# Patient Record
Sex: Female | Born: 1995 | Race: White | Hispanic: No | Marital: Married | Smoking: Never smoker
Health system: Southern US, Community
[De-identification: ages and names within clinical notes are randomized; demographics above are authoritative.]

---

## 2016-12-20 ENCOUNTER — Ambulatory Visit (INDEPENDENT_AMBULATORY_CARE_PROVIDER_SITE_OTHER): Payer: 59 | Admitting: Family

## 2016-12-20 ENCOUNTER — Encounter: Payer: Self-pay | Admitting: Family

## 2016-12-20 VITALS — BP 129/79 | HR 85 | Temp 98.7°F | Ht 68.0 in | Wt 125.4 lb

## 2016-12-20 DIAGNOSIS — Z0000 Encounter for general adult medical examination without abnormal findings: Secondary | ICD-10-CM

## 2016-12-20 DIAGNOSIS — Z111 Encounter for screening for respiratory tuberculosis: Secondary | ICD-10-CM

## 2016-12-20 NOTE — Progress Notes (Signed)
   Subjective:    Patient ID: Dorothy Douglas, female    DOB: [birth year], 21 y.o.   MRN: [id number]  HPI Pt presents to the office today for CPE without pap. Currently taking OC with no complaints. Pt denies any headache, palpitations, SOB, or edema at this time.     Review of Systems  All other systems reviewed and are negative.  History reviewed. No pertinent family history.  Social History   Social History  . Marital status: Married    Spouse name: N/A  . Number of children: N/A  . Years of education: N/A   Social History Main Topics  . Smoking status: Never Smoker  . Smokeless tobacco: Never Used  . Alcohol use 0.6 oz/week    1 Glasses of wine per week  . Drug use: No  . Sexual activity: Yes   Other Topics Concern  . None   Social History Narrative  . None       Objective:   Physical Exam  Constitutional: She is oriented to person, place, and time. She appears well-developed and well-nourished. No distress.  HENT:  Head: Normocephalic and atraumatic.  Right Ear: External ear normal.  Left Ear: External ear normal.  Nose: Nose normal.  Mouth/Throat: Oropharynx is clear and moist.  Eyes: Pupils are equal, round, and reactive to light.  Neck: Normal range of motion. Neck supple. No thyromegaly present.  Cardiovascular: Normal rate, regular rhythm, normal heart sounds and intact distal pulses.   No murmur heard. Pulmonary/Chest: Effort normal and breath sounds normal. No respiratory distress. She has no wheezes.  Abdominal: Soft. Bowel sounds are normal. She exhibits no distension. There is no tenderness.  Musculoskeletal: Normal range of motion. She exhibits no edema or tenderness.  Neurological: She is alert and oriented to person, place, and time.  Skin: Skin is warm and dry.  Psychiatric: She has a normal mood and affect. Her behavior is normal. Judgment and thought content normal.  Vitals reviewed.    BP 129/79   Pulse 85   Temp 98.7 F (37.1 C)  (Oral)   Ht 5\' 8"  (1.727 m)   Wt 125 lb 6.4 oz (56.9 kg)   LMP 12/15/2016 (Exact Date)   BMI 19.07 kg/m      Assessment & Plan:  1. Annual physical exam   Continue all meds Pt refuses labs today Health Maintenance reviewed Diet and exercise encouraged RTO 1 year   Jannifer Rodney, FNP

## 2016-12-20 NOTE — Addendum Note (Signed)
Addended by: Tamera Punt on: 12/20/2016 02:45 PM   Modules accepted: Orders

## 2016-12-20 NOTE — Patient Instructions (Signed)

## 2016-12-23 ENCOUNTER — Ambulatory Visit: Payer: Self-pay | Admitting: Pediatrics

## 2017-07-28 ENCOUNTER — Ambulatory Visit (INDEPENDENT_AMBULATORY_CARE_PROVIDER_SITE_OTHER): Payer: BC Managed Care – PPO

## 2017-07-28 ENCOUNTER — Ambulatory Visit: Payer: BC Managed Care – PPO | Admitting: Family

## 2017-07-28 ENCOUNTER — Encounter: Payer: Self-pay | Admitting: Family

## 2017-07-28 VITALS — BP 126/71 | HR 81 | Temp 97.6°F | Ht 68.0 in | Wt 132.2 lb

## 2017-07-28 DIAGNOSIS — R0781 Pleurodynia: Secondary | ICD-10-CM

## 2017-07-28 DIAGNOSIS — R1011 Right upper quadrant pain: Secondary | ICD-10-CM

## 2017-07-28 DIAGNOSIS — M546 Pain in thoracic spine: Secondary | ICD-10-CM | POA: Diagnosis not present

## 2017-07-28 DIAGNOSIS — G8929 Other chronic pain: Secondary | ICD-10-CM

## 2017-07-28 NOTE — Patient Instructions (Signed)
Costochondritis Costochondritis is swelling and irritation (inflammation) of the tissue (cartilage) that connects your ribs to your breastbone (sternum). This causes pain in the front of your chest. The pain usually starts gradually and involves more than one rib. What are the causes? The exact cause of this condition is not always known. It results from stress on the cartilage where your ribs attach to your sternum. The cause of this stress could be:  Chest injury (trauma).  Exercise or activity, such as lifting.  Severe coughing.  What increases the risk? You may be at higher risk for this condition if you:  Are female.  Are 30?24 years old.  Recently started a new exercise or work activity.  Have low levels of vitamin D.  Have a condition that makes you cough frequently.  What are the signs or symptoms? The main symptom of this condition is chest pain. The pain:  Usually starts gradually and can be sharp or dull.  Gets worse with deep breathing, coughing, or exercise.  Gets better with rest.  May be worse when you press on the sternum-rib connection (tenderness).  How is this diagnosed? This condition is diagnosed based on your symptoms, medical history, and a physical exam. Your health care provider will check for tenderness when pressing on your sternum. This is the most important finding. You may also have tests to rule out other causes of chest pain. These may include:  A chest X-ray to check for lung problems.  An electrocardiogram (ECG) to see if you have a heart problem that could be causing the pain.  An imaging scan to rule out a chest or rib fracture.  How is this treated? This condition usually goes away on its own over time. Your health care provider may prescribe an NSAID to reduce pain and inflammation. Your health care provider may also suggest that you:  Rest and avoid activities that make pain worse.  Apply heat or cold to the area to reduce pain  and inflammation.  Do exercises to stretch your chest muscles.  If these treatments do not help, your health care provider may inject a numbing medicine at the sternum-rib connection to help relieve the pain. Follow these instructions at home:  Avoid activities that make pain worse. This includes any activities that use chest, abdominal, and side muscles.  If directed, put ice on the painful area: ? Put ice in a plastic bag. ? Place a towel between your skin and the bag. ? Leave the ice on for 20 minutes, 2-3 times a day.  If directed, apply heat to the affected area as often as told by your health care provider. Use the heat source that your health care provider recommends, such as a moist heat pack or a heating pad. ? Place a towel between your skin and the heat source. ? Leave the heat on for 20-30 minutes. ? Remove the heat if your skin turns bright red. This is especially important if you are unable to feel pain, heat, or cold. You may have a greater risk of getting burned.  Take over-the-counter and prescription medicines only as told by your health care provider.  Return to your normal activities as told by your health care provider. Ask your health care provider what activities are safe for you.  Keep all follow-up visits as told by your health care provider. This is important. Contact a health care provider if:  You have chills or a fever.  Your pain does not go   away or it gets worse.  You have a cough that does not go away (is persistent). Get help right away if:  You have shortness of breath. This information is not intended to replace advice given to you by your health care provider. Make sure you discuss any questions you have with your health care provider. Document Released: 01/30/2005 Document Revised: 11/10/2015 Document Reviewed: 08/16/2015 Elsevier Interactive Patient Education  2018 Elsevier Inc[employer]   

## 2017-07-28 NOTE — Progress Notes (Signed)
Subjective:    Patient ID: Dorothy Douglas, female    DOB: 1995-05-09, 22 y.o.   MRN: [id number]  HPI PT presents to the office today with right rib pain that radiates to her back. States this pain is been going on and off over the last 2 years. PT states she has taken naprosyn with mild relief.  Pt states she went to an Urgent Care last year and had a negative EKG and chest x-ray and was told it was inflammation. States the pain has never went away. States she has constant aching pain of 6-7 out 10 pain and has "flare ups" where the pain is worse and she can not lay on her back or side.   She not recall anything that makes the pain worse or triggers it.     Review of Systems  All other systems reviewed and are negative.      Objective:   Physical Exam  Constitutional: She is oriented to person, place, and time. She appears well-developed and well-nourished. No distress.  HENT:  Head: Normocephalic and atraumatic.  Right Ear: External ear normal.  Left Ear: External ear normal.  Nose: Nose normal.  Mouth/Throat: Oropharynx is clear and moist.  Eyes: Pupils are equal, round, and reactive to light.  Neck: Normal range of motion. Neck supple. No thyromegaly present.  Cardiovascular: Normal rate, regular rhythm, normal heart sounds and intact distal pulses.  No murmur heard. Pulmonary/Chest: Effort normal and breath sounds normal. No respiratory distress. She has no wheezes.  Abdominal: Soft. Bowel sounds are normal. She exhibits no distension. There is tenderness (RUQ and LUQ).  Musculoskeletal: Normal range of motion. She exhibits no edema or tenderness.  Neurological: She is alert and oriented to person, place, and time.  Skin: Skin is warm and dry.  Psychiatric: She has a normal mood and affect. Her behavior is normal. Judgment and thought content normal.  Vitals reviewed.     BP 126/71   Pulse 81   Temp 97.6 F (36.4 C) (Oral)   Ht 5' 8" (1.727 m)   Wt 132 lb 3.2 oz  (60 kg)   BMI 20.10 kg/m      Assessment & Plan:  1. Right upper quadrant abdominal pain - Anemia Profile B - CMP14+EGFR - H Pylori, IGM, IGG, IGA AB - VITAMIN D 25 Hydroxy (Vit-D Deficiency, Fractures)  2. Chronic right-sided thoracic back pain - Anemia Profile B - CMP14+EGFR - DG Chest 2 View; Future - VITAMIN D 25 Hydroxy (Vit-D Deficiency, Fractures)  3. Rib pain - Anemia Profile B - CMP14+EGFR - DG Chest 2 View; Future - VITAMIN D 25 Hydroxy (Vit-D Deficiency, Fractures)  Labs pending If elevated WBC or labs negative will order abd [place] to rule out cholecystitis   Evelina Dun, FNP

## 2017-07-29 LAB — ANEMIA PROFILE B
BASOS: 1 %
Basophils Absolute: 0 10*3/uL (ref 0.0–0.2)
EOS (ABSOLUTE): 0.1 10*3/uL (ref 0.0–0.4)
EOS: 2 %
FERRITIN: 31 ng/mL (ref 15–150)
Folate: 18.9 ng/mL (ref >3.0)
HEMATOCRIT: 41 % (ref 34.0–46.6)
HEMOGLOBIN: 13.3 g/dL (ref 11.1–15.9)
IMMATURE GRANS (ABS): 0 10*3/uL (ref 0.0–0.1)
IRON: 118 ug/dL (ref 27–159)
Immature Granulocytes: 0 %
Iron Saturation: 33 % (ref 15–55)
LYMPHS: 42 %
Lymphocytes Absolute: 2.3 10*3/uL (ref 0.7–3.1)
MCH: 28.3 pg (ref 26.6–33.0)
MCHC: 32.4 g/dL (ref 31.5–35.7)
MCV: 87 fL (ref 79–97)
MONOCYTES: 9 %
Monocytes Absolute: 0.5 10*3/uL (ref 0.1–0.9)
Neutrophils Absolute: 2.5 10*3/uL (ref 1.4–7.0)
Neutrophils: 46 %
Platelets: 298 10*3/uL (ref 150–379)
RBC: 4.7 x10E6/uL (ref 3.77–5.28)
RDW: 13.1 % (ref 12.3–15.4)
RETIC CT PCT: 0.9 % (ref 0.6–2.6)
TIBC: 363 ug/dL (ref 250–450)
UIBC: 245 ug/dL (ref 131–425)
Vitamin B-12: 468 pg/mL (ref 232–1245)
WBC: 5.5 10*3/uL (ref 3.4–10.8)

## 2017-07-29 LAB — CMP14+EGFR
A/G RATIO: 1.6 (ref 1.2–2.2)
ALBUMIN: 4.5 g/dL (ref 3.5–5.5)
ALT: 12 IU/L (ref 0–32)
AST: 11 IU/L (ref 0–40)
Alkaline Phosphatase: 54 IU/L (ref 39–117)
BUN/Creatinine Ratio: 12 (ref 9–23)
BUN: 8 mg/dL (ref 6–20)
Bilirubin Total: <0.2 mg/dL (ref 0.0–1.2)
CALCIUM: 9.3 mg/dL (ref 8.7–10.2)
CO2: 24 mmol/L (ref 20–29)
Chloride: 102 mmol/L (ref 96–106)
Creatinine, Ser: 0.65 mg/dL (ref 0.57–1.00)
GFR calc Af Amer: 144 mL/min/{1.73_m2} (ref >59)
GFR, EST NON AFRICAN AMERICAN: 125 mL/min/{1.73_m2} (ref >59)
Globulin, Total: 2.9 g/dL (ref 1.5–4.5)
Glucose: 69 mg/dL (ref 65–99)
Potassium: 3.9 mmol/L (ref 3.5–5.2)
Sodium: 141 mmol/L (ref 134–144)
TOTAL PROTEIN: 7.4 g/dL (ref 6.0–8.5)

## 2017-07-29 LAB — H PYLORI, IGM, IGG, IGA AB
H. pylori, IgA Abs: <9 units (ref 0.0–8.9)
H. pylori, IgG AbS: <0.8 Index Value (ref 0.00–0.79)

## 2017-07-30 LAB — SPECIMEN STATUS REPORT

## 2017-07-30 LAB — VITAMIN D 25 HYDROXY (VIT D DEFICIENCY, FRACTURES): VIT D 25 HYDROXY: 32.3 ng/mL (ref 30.0–100.0)

## 2017-07-31 NOTE — Progress Notes (Signed)
Pt is aware of lab results and verbalizes understanding

## 2017-08-05 ENCOUNTER — Ambulatory Visit: Payer: BC Managed Care – PPO | Admitting: Family

## 2017-08-05 ENCOUNTER — Encounter: Payer: Self-pay | Admitting: Family

## 2017-08-05 VITALS — BP 122/71 | HR 71 | Temp 97.6°F | Ht 68.0 in | Wt 132.6 lb

## 2017-08-05 DIAGNOSIS — M545 Low back pain, unspecified: Secondary | ICD-10-CM

## 2017-08-05 DIAGNOSIS — R1011 Right upper quadrant pain: Secondary | ICD-10-CM | POA: Diagnosis not present

## 2017-08-05 LAB — URINALYSIS, COMPLETE
Bilirubin, UA: NEGATIVE
Glucose, UA: NEGATIVE
KETONES UA: NEGATIVE
Leukocytes, UA: NEGATIVE
NITRITE UA: NEGATIVE
Protein, UA: NEGATIVE
RBC UA: NEGATIVE
SPEC GRAV UA: 1.025 (ref 1.005–1.030)
UUROB: 0.2 mg/dL (ref 0.2–1.0)
pH, UA: 7 (ref 5.0–7.5)

## 2017-08-05 LAB — MICROSCOPIC EXAMINATION
RBC MICROSCOPIC, UA: NONE SEEN /HPF (ref 0–2)
Renal Epithel, UA: NONE SEEN /hpf

## 2017-08-05 NOTE — Patient Instructions (Addendum)

## 2017-08-05 NOTE — Progress Notes (Signed)
   Subjective:    Patient ID: Dorothy Douglas, female    DOB: 11/11/1995, 22 y.o.   MRN: [id number]  HPI Pt presents to the office today with stabbing abdominal pain that radiates to her back that started over a year ago, but has become worse. PT states she has been diagnosed with costochondritis and given naprosyn with no relief.   PT was seen  07/28/17 and had negative chest x-ray, normal CMP, Anemia profile, H pylori, and Vitamin D. She reports her pain is worse since our last office visit. Reports constant stabbing, aching pain of 7 out 10. States the pain is worse at the end of the day.     Review of Systems  Gastrointestinal: Positive for abdominal pain.  Musculoskeletal: Positive for back pain.  All other systems reviewed and are negative.      Objective:   Physical Exam  Constitutional: She is oriented to person, place, and time. She appears well-developed and well-nourished. No distress.  HENT:  Head: Normocephalic.  Eyes: Pupils are equal, round, and reactive to light.  Neck: Normal range of motion. Neck supple. No thyromegaly present.  Cardiovascular: Normal rate, regular rhythm, normal heart sounds and intact distal pulses.  No murmur heard. Pulmonary/Chest: Effort normal and breath sounds normal. No respiratory distress. She has no wheezes.  Abdominal: Soft. Bowel sounds are normal. She exhibits no distension. There is tenderness (RUQ). There is rebound.  Musculoskeletal: Normal range of motion. She exhibits no edema or tenderness.  Neurological: She is alert and oriented to person, place, and time.  Skin: Skin is warm and dry.  Psychiatric: She has a normal mood and affect. Her behavior is normal. Judgment and thought content normal.  Vitals reviewed.  BP 122/71   Pulse 71   Temp 97.6 F (36.4 C) (Oral)   Ht 5\' 8"  (1.727 m)   Wt 132 lb 9.6 oz (60.1 kg)   BMI 20.16 kg/m       Assessment & Plan:  1. RUQ pain - Urinalysis, Complete - US Abdomen Complete;  Future  2. Acute bilateral low back pain without sciatica - Urinalysis, Complete - US Abdomen Complete; Future  Urine pending Will do Abd US to rule out gallbladder RTO prn or if pain worsens or does not improve  Jannifer Rodneyhristy Vernard Gram, FNP

## 2017-08-13 ENCOUNTER — Ambulatory Visit (HOSPITAL_COMMUNITY)
Admission: RE | Admit: 2017-08-13 | Discharge: 2017-08-13 | Disposition: A | Discharge status: Home / Self Care | Payer: Self-pay | Source: Ambulatory Visit | Attending: Family | Admitting: Family

## 2017-08-13 ENCOUNTER — Ambulatory Visit (HOSPITAL_COMMUNITY)
Admission: RE | Admit: 2017-08-13 | Discharge: 2017-08-13 | Disposition: A | Discharge status: Home / Self Care | Payer: BC Managed Care – PPO | Source: Ambulatory Visit | Attending: Family | Admitting: Family

## 2017-08-13 DIAGNOSIS — R1011 Right upper quadrant pain: Secondary | ICD-10-CM

## 2017-08-13 DIAGNOSIS — M545 Low back pain, unspecified: Secondary | ICD-10-CM

## 2017-08-14 ENCOUNTER — Telehealth: Payer: Self-pay | Admitting: Family

## 2017-08-14 ENCOUNTER — Encounter: Payer: Self-pay | Admitting: Family

## 2017-08-14 DIAGNOSIS — R1011 Right upper quadrant pain: Secondary | ICD-10-CM

## 2017-08-14 NOTE — Telephone Encounter (Signed)
SHE WANTS TO KNOW WHAT IS NEXT? STILL HAVING PAIN. DOES THIS RULE GALLBLADDER OUT

## 2017-08-14 NOTE — Telephone Encounter (Signed)
Pt is rtn call about test results

## 2017-08-15 NOTE — Addendum Note (Signed)
Addended by: Jannifer RodneyHAWKS, CHRISTY A on: 08/15/2017 10:33 AM   Modules accepted: Orders

## 2017-08-15 NOTE — Telephone Encounter (Signed)
Hida scan  Ordered

## 2017-08-15 NOTE — Telephone Encounter (Signed)
See other note

## 2017-08-19 ENCOUNTER — Encounter (HOSPITAL_COMMUNITY): Payer: Self-pay

## 2017-08-19 ENCOUNTER — Encounter (HOSPITAL_COMMUNITY)
Admission: RE | Admit: 2017-08-19 | Discharge: 2017-08-19 | Disposition: A | Discharge status: Home / Self Care | Payer: BC Managed Care – PPO | Source: Ambulatory Visit | Attending: Family | Admitting: Family

## 2017-08-19 ENCOUNTER — Encounter: Payer: Self-pay | Admitting: Family

## 2017-08-19 ENCOUNTER — Other Ambulatory Visit: Payer: Self-pay | Admitting: Family

## 2017-08-19 DIAGNOSIS — R1011 Right upper quadrant pain: Secondary | ICD-10-CM | POA: Insufficient documentation

## 2017-08-19 MED ORDER — MORPHINE SULFATE (PF) 4 MG/ML IV SOLN
INTRAVENOUS | Status: AC
  Administered 2017-08-19: 2.4 mg
  Filled 2017-08-19: qty 1

## 2017-08-19 MED ORDER — MORPHINE SULFATE (PF) 4 MG/ML IV SOLN: 2.4000 mg | Freq: Once | INTRAVENOUS | Status: DC

## 2017-08-19 MED ORDER — TECHNETIUM TC 99M MEBROFENIN IV KIT
5.0000 | PACK | Freq: Once | INTRAVENOUS | Status: AC | PRN
  Administered 2017-08-19: 5 via INTRAVENOUS

## 2017-08-26 ENCOUNTER — Other Ambulatory Visit: Payer: BC Managed Care – PPO

## 2017-08-26 ENCOUNTER — Ambulatory Visit: Payer: BC Managed Care – PPO | Admitting: Family

## 2017-08-26 ENCOUNTER — Encounter: Payer: Self-pay | Admitting: Family

## 2017-08-26 VITALS — BP 116/68 | HR 72 | Temp 97.4°F | Ht 68.0 in | Wt 130.4 lb

## 2017-08-26 DIAGNOSIS — R0781 Pleurodynia: Secondary | ICD-10-CM | POA: Diagnosis not present

## 2017-08-26 DIAGNOSIS — M546 Pain in thoracic spine: Secondary | ICD-10-CM | POA: Diagnosis not present

## 2017-08-26 DIAGNOSIS — G8929 Other chronic pain: Secondary | ICD-10-CM | POA: Diagnosis not present

## 2017-08-26 MED ORDER — CYCLOBENZAPRINE HCL 5 MG PO TABS: 5.0000 mg | ORAL_TABLET | Freq: Three times a day (TID) | ORAL | 1 refills | Status: DC | PRN

## 2017-08-26 MED ORDER — PREDNISONE 10 MG (21) PO TBPK: ORAL_TABLET | ORAL | 0 refills | Status: DC

## 2017-08-26 MED ORDER — DICLOFENAC SODIUM 75 MG PO TBEC: 75.0000 mg | DELAYED_RELEASE_TABLET | Freq: Two times a day (BID) | ORAL | 0 refills | Status: DC

## 2017-08-26 NOTE — Progress Notes (Signed)
   Subjective:    Patient ID: Dorothy Douglas, female    DOB: 04/08/1996, 22 y.o.   MRN: [id number]  HPI PT presents to the office today with recurrent right and left rib pain. Pt has had negative US and HIDA scan. States her pain is constant, but worse when she stands for long periods of time. She reports a constant achy pain of 3 but up to a 7-8 out 10 after she has stood all day.    She has tried naprosyn with mild relief.    Review of Systems  All other systems reviewed and are negative.      Objective:   Physical Exam  Constitutional: She is oriented to person, place, and time. She appears well-developed and well-nourished. No distress.  HENT:  Head: Normocephalic and atraumatic.  Right Ear: External ear normal.  Mouth/Throat: Oropharynx is clear and moist.  Eyes: Pupils are equal, round, and reactive to light.  Neck: Normal range of motion. Neck supple. No thyromegaly present.  Cardiovascular: Normal rate, regular rhythm, normal heart sounds and intact distal pulses.  No murmur heard. Pulmonary/Chest: Effort normal and breath sounds normal. No respiratory distress. She has no wheezes.  Abdominal: Soft. Bowel sounds are normal. She exhibits no distension. There is no tenderness.  Musculoskeletal: Normal range of motion. She exhibits tenderness. She exhibits no edema.  In thoracic area with flexion and deep breathing  Neurological: She is alert and oriented to person, place, and time. She has normal reflexes. No cranial nerve deficit.  Skin: Skin is warm and dry.  Psychiatric: She has a normal mood and affect. Her behavior is normal. Judgment and thought content normal.  Vitals reviewed.     BP 116/68   Pulse 72   Temp (!) 97.4 F (36.3 C) (Oral)   Ht 5\' 8"  (1.727 m)   Wt 130 lb 6.4 oz (59.1 kg)   BMI 19.83 kg/m      Assessment & Plan:  1. Rib pain - Ambulatory referral to Spine Surgery - diclofenac (VOLTAREN) 75 MG EC tablet; Take 1 tablet (75 mg total) by mouth  2 (two) times daily.  Dispense: 30 tablet; Refill: 0  2. Chronic bilateral thoracic back pain Rest Ice ROM exercises  RTO prn and follow up with specialists  - Ambulatory referral to Spine Surgery - diclofenac (VOLTAREN) 75 MG EC tablet; Take 1 tablet (75 mg total) by mouth 2 (two) times daily.  Dispense: 30 tablet; Refill: 0 - cyclobenzaprine (FLEXERIL) 5 MG tablet; Take 1 tablet (5 mg total) by mouth 3 (three) times daily as needed for muscle spasms.  Dispense: 30 tablet; Refill: 1 - predniSONE (STERAPRED UNI-PAK 21 TAB) 10 MG (21) TBPK tablet; Use as directed  Dispense: 21 tablet; Refill: 0    Jannifer Rodneyhristy Lashauna Arpin, FNP

## 2017-08-26 NOTE — Patient Instructions (Signed)
Thoracic Strain A thoracic strain, which is sometimes called a mid-back strain, is an injury to the muscles or tendons that attach to the upper part of your back behind your chest. This type of injury occurs when a muscle is overstretched or overloaded. Thoracic strains can range from mild to severe. Mild strains may involve stretching a muscle or tendon without tearing it. These injuries may heal in 1-2 weeks. More severe strains involve tearing of muscle fibers or tendons. These will cause more pain and may take 6-8 weeks to heal. What are the causes? This condition may be caused by:  An injury in which a sudden force is placed on the muscle.  Exercising without properly warming up.  Overuse of the muscle.  Improper form during certain movements.  Other injuries that surround or cause stress on the mid-back, causing a strain on the muscles.  In some cases, the cause may not be known. What increases the risk? This injury is more common in:  Athletes.  People with obesity.  What are the signs or symptoms? The main symptom of this condition is pain, especially with movement. Other symptoms include:  Bruising.  Swelling.  Spasm.  How is this diagnosed? This condition may be diagnosed with a physical exam. X-rays may be taken to check for a fracture. How is this treated? This condition may be treated with:  Resting and icing the injured area.  Physical therapy. This will involve doing stretching and strengthening exercises.  Medicines for pain and inflammation.  Follow these instructions at home:  Rest as needed. Follow instructions from your health care provider about any restrictions on activity.  If directed, apply ice to the injured area: ? Put ice in a plastic bag. ? Place a towel between your skin and the bag. ? Leave the ice on for 20 minutes, 2-3 times per day.  Take over-the-counter and prescription medicines only as told by your health care  provider.  Begin doing exercises as told by your health care provider or physical therapist.  Always warm up properly before physical activity or sports.  Bend your knees before you lift heavy objects.  Keep all follow-up visits as told by your health care provider. This is important. Contact a health care provider if:  Your pain is not helped by medicine.  Your pain, bruising, or swelling is getting worse.  You have a fever. Get help right away if:  You have shortness of breath.  You have chest pain.  You develop numbness or weakness in your legs.  You have involuntary loss of urine (urinary incontinence). This information is not intended to replace advice given to you by your health care provider. Make sure you discuss any questions you have with your health care provider. Document Released: 07/13/2003 Document Revised: 12/23/2015 Document Reviewed: 06/16/2014 Elsevier Interactive Patient Education  2018 Elsevier Inc.  

## 2017-09-02 ENCOUNTER — Encounter: Payer: Self-pay | Admitting: Family

## 2017-09-30 ENCOUNTER — Ambulatory Visit: Payer: BC Managed Care – PPO | Admitting: Family

## 2017-09-30 ENCOUNTER — Encounter: Payer: Self-pay | Admitting: Family

## 2017-09-30 VITALS — BP 125/78 | HR 100 | Temp 99.1°F | Ht 68.0 in | Wt 129.0 lb

## 2017-09-30 DIAGNOSIS — J020 Streptococcal pharyngitis: Secondary | ICD-10-CM | POA: Diagnosis not present

## 2017-09-30 DIAGNOSIS — J029 Acute pharyngitis, unspecified: Secondary | ICD-10-CM | POA: Diagnosis not present

## 2017-09-30 LAB — RAPID STREP SCREEN (MED CTR MEBANE ONLY): Strep Gp A Ag, IA W/Reflex: POSITIVE — AB

## 2017-09-30 MED ORDER — AMOXICILLIN 500 MG PO CAPS: 500.0000 mg | ORAL_CAPSULE | Freq: Two times a day (BID) | ORAL | 0 refills | Status: DC

## 2017-09-30 NOTE — Patient Instructions (Signed)
Strep Throat Strep throat is a bacterial infection of the throat. Your health care provider may call the infection tonsillitis or pharyngitis, depending on whether there is swelling in the tonsils or at the back of the throat. Strep throat is most common during the cold months of the year in children who are 5-24 years of age, but it can happen during any season in people of any age. This infection is spread from person to person (contagious) through coughing, sneezing, or close contact. What are the causes? Strep throat is caused by the bacteria called Streptococcus pyogenes. What increases the risk? This condition is more likely to develop in:  People who spend time in crowded places where the infection can spread easily.  People who have close contact with someone who has strep throat.  What are the signs or symptoms? Symptoms of this condition include:  Fever or chills.  Redness, swelling, or pain in the tonsils or throat.  Pain or difficulty when swallowing.  White or yellow spots on the tonsils or throat.  Swollen, tender glands in the neck or under the jaw.  Red rash all over the body (rare).  How is this diagnosed? This condition is diagnosed by performing a rapid strep test or by taking a swab of your throat (throat culture test). Results from a rapid strep test are usually ready in a few minutes, but throat culture test results are available after one or two days. How is this treated? This condition is treated with antibiotic medicine. Follow these instructions at home: Medicines  Take over-the-counter and prescription medicines only as told by your health care provider.  Take your antibiotic as told by your health care provider. Do not stop taking the antibiotic even if you start to feel better.  Have family members who also have a sore throat or fever tested for strep throat. They may need antibiotics if they have the strep infection. Eating and drinking  Do not  share food, drinking cups, or personal items that could cause the infection to spread to other people.  If swallowing is difficult, try eating soft foods until your sore throat feels better.  Drink enough fluid to keep your urine clear or pale yellow. General instructions  Gargle with a salt-water mixture 3-4 times per day or as needed. To make a salt-water mixture, completely dissolve -1 tsp of salt in 1 cup of warm water.  Make sure that all household members wash their hands well.  Get plenty of rest.  Stay home from school or work until you have been taking antibiotics for 24 hours.  Keep all follow-up visits as told by your health care provider. This is important. Contact a health care provider if:  The glands in your neck continue to get bigger.  You develop a rash, cough, or earache.  You cough up a thick liquid that is green, yellow-brown, or bloody.  You have pain or discomfort that does not get better with medicine.  Your problems seem to be getting worse rather than better.  You have a fever. Get help right away if:  You have new symptoms, such as vomiting, severe headache, stiff or painful neck, chest pain, or shortness of breath.  You have severe throat pain, drooling, or changes in your voice.  You have swelling of the neck, or the skin on the neck becomes red and tender.  You have signs of dehydration, such as fatigue, dry mouth, and decreased urination.  You become increasingly sleepy, or   you cannot wake up completely.  Your joints become red or painful. This information is not intended to replace advice given to you by your health care provider. Make sure you discuss any questions you have with your health care provider. Document Released: 04/19/2000 Document Revised: 12/20/2015 Document Reviewed: 08/15/2014 Elsevier Interactive Patient Education  2018 Else[employer] Inc.  

## 2017-09-30 NOTE — Progress Notes (Signed)
   Subjective:    Patient ID: Dorothy Douglas, female    DOB: Sep 19, 1995, 22 y.o.   MRN: [id number]  Sore Throat   This is a new problem. The current episode started in the past 7 days. Associated symptoms include coughing, ear pain and headaches.  Cough  This is a new problem. The current episode started 1 to 4 weeks ago. The problem has been gradually worsening. The problem occurs every few minutes. The cough is productive of purulent sputum. Associated symptoms include chills, ear congestion, ear pain, a fever, headaches, nasal congestion, postnasal drip, rhinorrhea and a sore throat. The symptoms are aggravated by lying down. She has tried rest and OTC cough suppressant for the symptoms. The treatment provided mild relief.  Sinus Problem  Associated symptoms include chills, coughing, ear pain, headaches and a sore throat.      Review of Systems  Constitutional: Positive for chills and fever.  HENT: Positive for ear pain, postnasal drip, rhinorrhea and sore throat.   Respiratory: Positive for cough.   Neurological: Positive for headaches.  All other systems reviewed and are negative.      Objective:   Physical Exam  Constitutional: She is oriented to person, place, and time. She appears well-developed and well-nourished. No distress.  HENT:  Head: Normocephalic and atraumatic.  Right Ear: External ear normal.  Nose: Mucosal edema and rhinorrhea present.  Mouth/Throat: Posterior oropharyngeal edema and posterior oropharyngeal erythema present. Tonsils are 2+ on the right. Tonsils are 2+ on the left.  Eyes: Pupils are equal, round, and reactive to light.  Neck: Normal range of motion. Neck supple. No thyromegaly present.  Cardiovascular: Normal rate, regular rhythm, normal heart sounds and intact distal pulses.  No murmur heard. Pulmonary/Chest: Effort normal and breath sounds normal. No respiratory distress. She has no wheezes.  Abdominal: Soft. Bowel sounds are normal. She  exhibits no distension. There is no tenderness.  Musculoskeletal: Normal range of motion. She exhibits no edema or tenderness.  Neurological: She is alert and oriented to person, place, and time. She has normal reflexes. No cranial nerve deficit.  Skin: Skin is warm and dry.  Psychiatric: She has a normal mood and affect. Her behavior is normal. Judgment and thought content normal.  Vitals reviewed.     BP 125/78   Pulse 100   Temp 99.1 F (37.3 C) (Oral)   Ht  (1.727 m)   Wt 129 lb (58.5 kg)   BMI 19.61 kg/m      Assessment & Plan:  Dorothy was seen today for sore throat, cough and sinus problem.  Diagnoses and all orders for this visit:  Sore throat -     Rapid Strep Screen ([facility] ONLY)  Strep throat -     amoxicillin (AMOXIL) 500 MG capsule; Take 1 capsule (500 mg total) by mouth 2 (two) times daily.   - Take meds as prescribed - Use a cool mist humidifier  -Use saline nose sprays frequently -Force fluids -For any cough or congestion  Use plain Mucinex- regular strength or max strength is fine -For fever or aces or pains- take tylenol or ibuprofen. -Throat lozenges if help -New toothbrush in 3 days   Jannifer Rodney, FNP

## 2017-10-01 ENCOUNTER — Other Ambulatory Visit: Payer: Self-pay | Admitting: Orthopaedic Surgery

## 2017-10-01 DIAGNOSIS — M546 Pain in thoracic spine: Secondary | ICD-10-CM

## 2017-10-07 ENCOUNTER — Ambulatory Visit
Admission: RE | Admit: 2017-10-07 | Discharge: 2017-10-07 | Disposition: A | Discharge status: Home / Self Care | Payer: BC Managed Care – PPO | Source: Ambulatory Visit | Attending: Orthopaedic Surgery | Admitting: Orthopaedic Surgery

## 2017-10-07 DIAGNOSIS — M546 Pain in thoracic spine: Secondary | ICD-10-CM

## 2017-11-05 ENCOUNTER — Ambulatory Visit: Payer: BC Managed Care – PPO | Attending: Orthopaedic Surgery | Admitting: Physical Therapy

## 2017-11-05 ENCOUNTER — Encounter: Payer: Self-pay | Admitting: Physical Therapy

## 2017-11-05 ENCOUNTER — Other Ambulatory Visit: Payer: Self-pay

## 2017-11-05 DIAGNOSIS — M546 Pain in thoracic spine: Secondary | ICD-10-CM | POA: Diagnosis not present

## 2017-11-05 NOTE — Patient Instructions (Signed)
[facility]  DRY NEEDLING CONSENT FORM   Trigger point dry needling is a physical therapy approach to treat Myofascial Pain and Dysfunction.  Dry Needling (DN) is a valuable and effective way to deactivate myofascial trigger points (muscle knots/pain). It is skilled intervention that uses a thin filiform needle to penetrate the skin and stimulate underlying myofascial trigger points, muscular, and connective tissues for the management of neuromusculoskeletal pain and movement impairments.  A local twitch response (LTR) will be elicited.  This can sometimes feel like a deep ache in the muscle during the procedure. Multiple trigger points in multiple muscles can be treated during each treatment.  No medication of any kind is injected.   As with any medical treatment and procedure, there are possible adverse events.  While significant adverse events are uncommon, they do sometimes occur and must be considered prior to giving consent.  1. Dry needling often causes a "post needling soreness".  There can be an increase in pain from a couple of hours to 2-3 days, followed by an improvement in the overall pain state. 2. Any time a needle is used there is a risk of infection.  However, we are using new, sterile, and disposable needles; infections are extremely rare. 3. There is a possibility that you may bleed or bruise.  You may feel tired and some nausea following treatment. 4. There is a rare possibility of a pneumothorax (air in the chest cavity). 5. Allergic reaction to nickel in the stainless steel needle. 6. If a nerve is touched, it may cause paresthesia (a prickling/shock sensation) which is usually brief, but may continue for a couple of days.  Following treatment stay hydrated.  Continue regular activities but not too vigorous initially after treatment for 24-48 hours.  Dry Needling is best when combined with other physical therapy interventions such as  strengthening, stretching and other therapeutic modalities.   PLEASE ANSWER THE FOLLOWING QUESTIONS:  Do you have a lack of sensation?   Y/N  Do you have a phobia or fear of needles  Y/N  Are you pregnant?    Y/N If yes:  How many weeks? __________ Do you have any implanted devices?  Y/N If yes:  Pacemaker/Spinal Cord Stimulator/Deep Brain Stimulator/Insulin Pump/Other: ________________ Do you have any implants?  Y/N If yes: Breast/Facial/Pecs/Buttocks/Calves/Hip  Replacement/ Knee Replacement/Other: _________ Do you take any blood thinners?   Y/N If yes: Coumadin (Warfarin)/Other: ___________________ Do you have a bleeding disorder?   Y/N If yes: What kind: _________________________________ Do you take any immunosuppressants?  Y/N If yes:   What kind: _________________________________ Do you take anti-inflammatories?   Y/N If yes: What kind: Advil/Aspirin/Other: ________________ Have you ever been diagnosed with Scoliosis? Y/N Have you had back surgery?   Y/N If yes:  Laminectomy/Fusion/Other: ___________________   I have read, or had read to me, the above.  I have had the opportunity to ask any questions.  All of my questions have been answered to my satisfaction and I understand the risks involved with dry needling.  I consent to examination and treatment at [facility], including dry needling, of any and all of my involved and affected muscles.  

## 2017-11-05 NOTE — Therapy (Addendum)
[facility] [address] [place], [place], [address] Phone: [phone]   Fax:  [phone]  Physical Therapy Evaluation  Patient Details  Name: Dorothy Douglas MRN: [id number] Date of Birth: 1996/03/03 Referring Provider: Sharolyn DouglasMax Cohen MD.   Encounter Date: 11/05/2017  PT End of Session - 11/05/17 1021    Visit Number  1    Number of Visits  12    Date for PT Re-Evaluation  12/17/17    PT Start Time  0946    PT Stop Time  1031    PT Time Calculation (min)  45 min    Activity Tolerance  Patient tolerated treatment well    Behavior During Therapy  [facility] for tasks assessed/performed       History reviewed. No pertinent past medical history.  History reviewed. No pertinent surgical history.  There were no vitals filed for this visit.   Subjective Assessment - 11/05/17 1107    Subjective  The patient reports mid-back pain coming on in November of 2018 for no apparent reason.  She reports pain goes into her anterior chest region.  She has had multiple test to r/o internal organ involvement which these tests were negative.  She also had a thoracic MRI which was negative.  Movement increases her pain and sleep and rest decrease her pain.    Patient Stated Goals  Get out of pain.    Currently in Pain?  Yes    Pain Score  5     Pain Location  Back    Pain Orientation  Right;Left;Anterior    Pain Descriptors / Indicators  Sharp    Pain Type  Chronic pain    Pain Onset  More than a month ago    Pain Frequency  Constant    Aggravating Factors   See above.    Pain Relieving Factors  See above.         [facility] - 11/05/17 0001      Assessment   Medical Diagnosis  Pain in thoracic spine.    Referring Provider  Sharolyn DouglasMax Cohen MD.    Onset Date/Surgical Date  -- 03/2017.      Precautions   Precautions  None      Restrictions   Weight Bearing Restrictions  No      Balance Screen   Has the patient fallen in the past 6 months  No     Has the patient had a decrease in activity level because of a fear of falling?   No    Is the patient reluctant to leave their home because of a fear of falling?   No      Home Public house managernvironment   Living Environment  Private residence      Prior Function   Level of Independence  Independent      Posture/Postural Control   Posture/Postural Control  No significant limitations      ROM / Strength   AROM / PROM / Strength  AROM;Strength      AROM   Overall AROM Comments  Full active spinal range of motion.      Strength   Overall Strength Comments  Normal extremity strength.      Palpation   Palpation comment  Tender with overpressure from T9-T12 and immediately adjacent to the spinous processes in the region.  Increased tone diffusely over bilateral lumbar musculature.      Special Tests   Other special tests  Normal bilateral U and  LE DTR's.  (=) leg lengths; (-) SLR and FABER testing.      Ambulation/Gait   Gait Comments  WNL.                Objective measurements completed on examination: See above findings.      [facility] Adult PT Treatment/Exercise - 11/05/17 0001      Modalities   Modalities  Electrical Stimulation;Moist Heat      Moist Heat Therapy   Number Minutes Moist Heat  20 Minutes    Moist Heat Location  -- Thoracic.      Programme researcher, broadcasting/film/video Location  Lower thoracic.    Electrical Stimulation Action  IFC    Electrical Stimulation Parameters  80-150 Hz x 20 minutes.    Electrical Stimulation Goals  Pain                  PT Long Term Goals - 11/05/17 1203      PT LONG TERM GOAL #1   Title  Independent with an HEP.    Time  6    Period  Weeks    Status  New      PT LONG TERM GOAL #2   Title  Perform all ADL's with pain.    Time  6    Period  Weeks    Status  New             Plan - 11/05/17 1200    Clinical Impression Statement  The patient presents to OPPT with c/o lower thoracic pain  radiating anteriorly into her lower chest/abdominal region.  ROM and strength is normal.  Reproducible pain with overpressure in lower thoracic region.  patient expected to do well with skilled physical therapy intervention.    Clinical Presentation  Evolving    Clinical Presentation due to:  Not improving.    Clinical Decision Making  Low    Rehab Potential  Good    PT Frequency  2x / week    PT Duration  6 weeks    PT Treatment/Interventions  Electrical Stimulation;Cryotherapy;Moist Heat;Ultrasound;Therapeutic exercise;Therapeutic activities;Patient/family education;Dry needling;Manual techniques    PT Next Visit Plan  Lower thoracic and costovertebral mobs; STW/M; HMP and e'stim; core exercises.    Consulted and Agree with Plan of Care  Patient       Patient will benefit from skilled therapeutic intervention in order to improve the following deficits and impairments:  Pain  Visit Diagnosis: Pain in thoracic spine - Plan: PT plan of care cert/re-cert     Problem List There are no active problems to display for this patient.   Laikynn Pollio, [place] MPT 11/05/2017, 12:07 PM  [facility] [address] [place], [place], [address] Phone: [phone]   Fax:  [phone]  Name: Dorothy Douglas MRN: [id number] Date of Birth: 10-Mar-1996

## 2017-11-11 ENCOUNTER — Ambulatory Visit: Payer: BC Managed Care – PPO | Admitting: *Deleted

## 2017-11-11 DIAGNOSIS — M546 Pain in thoracic spine: Secondary | ICD-10-CM

## 2017-11-11 NOTE — Therapy (Signed)
[facility] [address] [place], [place], [address] Phone: [phone]   Fax:  [phone]  Physical Therapy Treatment  Patient Details  Name: Dorothy Douglas MRN: [id number] Date of Birth: November 02, 1995 Referring Provider: Sharolyn Douglas MD.   Encounter Date: 11/11/2017  PT End of Session - 11/11/17 1002    Visit Number  2    Number of Visits  12    Date for PT Re-Evaluation  12/17/17    PT Start Time  0945    PT Stop Time  1035    PT Time Calculation (min)  50 min       No past medical history on file.  No past surgical history on file.  There were no vitals filed for this visit.                    [facility] Adult PT Treatment/Exercise - 11/11/17 0001      Exercises   Exercises  Lumbar;Knee/Hip      Lumbar Exercises: Aerobic   UBE (Upper Arm Bike)  90 RPMs x 6 mins      Lumbar Exercises: Standing   Row  Strengthening;20 reps;Theraband    Theraband Level (Row)  Level 2 (Red)    Shoulder Extension  Strengthening;20 reps;Theraband    Theraband Level (Shoulder Extension)  Level 2 (Red)    Other Standing Lumbar Exercises  Lat pulldown Red x20      Lumbar Exercises: Supine   Ab Set  10 reps;5 seconds    Bent Knee Raise  3 seconds;10 reps    Bridge  20 reps    Straight Leg Raise  10 reps;3 seconds      Lumbar Exercises: Prone   Opposite Arm/Leg Raise  20 reps;3 seconds      Modalities   Modalities  --      Moist Heat Therapy   Number Minutes Moist Heat  --    Moist Heat Location  --      Electrical Stimulation   Electrical Stimulation Location  --    Electrical Stimulation Goals  --                  PT Long Term Goals - 11/05/17 1203      PT LONG TERM GOAL #1   Title  Independent with an HEP.    Time  6    Period  Weeks    Status  New      PT LONG TERM GOAL #2   Title  Perform all ADL's with pain.    Time  6    Period  Weeks    Status  New            Plan - 11/11/17 1009    Clinical Impression Statement  Pt arrived today doing fairly well with pain level 5/10 which is a baseline as per pt. She reports being very sore after eval in Thoracolumbaar area. Rx today focused on Thoracolumbar stabilization exs. Pt was instructed through supine,prone, and standing therex for HEP and given sheets and red tband.    Clinical Presentation  Evolving    Rehab Potential  Good    PT Frequency  2x / week    PT Duration  6 weeks    PT Treatment/Interventions  Electrical Stimulation;Cryotherapy;Moist Heat;Ultrasound;Therapeutic exercise;Therapeutic activities;Patient/family education;Dry needling;Manual techniques    PT Next Visit Plan  ; STW/M; HMP and e'stim; core exercises.   review HEP spinal atability exs  Consulted and Agree with Plan of Care  Patient       Patient will benefit from skilled therapeutic intervention in order to improve the following deficits and impairments:  Pain  Visit Diagnosis: Pain in thoracic spine     Problem List There are no active problems to display for this patient.   Soham Hollett,CHRIS, PTA 11/11/2017, 10:16 AM  [facility] [address] [place], [place], [address] Phone: [phone]   Fax:  [phone]  Name: Dorothy Douglas MRN: [id number] Date of Birth: 08-05-1995

## 2017-11-13 ENCOUNTER — Encounter: Payer: Self-pay | Admitting: Physical Therapy

## 2017-11-13 ENCOUNTER — Ambulatory Visit: Payer: BC Managed Care – PPO | Admitting: Physical Therapy

## 2017-11-13 DIAGNOSIS — M546 Pain in thoracic spine: Secondary | ICD-10-CM

## 2017-11-13 NOTE — Therapy (Addendum)
[facility] [address] [place], [place], [address] Phone: [phone]   Fax:  [phone]  Physical Therapy Treatment  Patient Details  Name: Dorothy Douglas MRN: [id number] Date of Birth: Feb 04, 1996 Referring Provider: Sharolyn Douglas MD.   Encounter Date: 11/13/2017  PT End of Session - 11/13/17 1114    Visit Number  3    Number of Visits  12    Date for PT Re-Evaluation  12/17/17    PT Start Time  0910 Late arrival.    PT Stop Time  1000    PT Time Calculation (min)  50 min    Activity Tolerance  Patient tolerated treatment well    Behavior During Therapy  [facility] for tasks assessed/performed       History reviewed. No pertinent past medical history.  History reviewed. No pertinent surgical history.  There were no vitals filed for this visit.  Subjective Assessment - 11/13/17 1120    Subjective  That last treatment was helpful.    Patient Stated Goals  Get out of pain.    Currently in Pain?  Yes    Pain Score  4     Pain Location  Back    Pain Orientation  Right;Left;Anterior    Pain Onset  More than a month ago                       [facility] Adult PT Treatment/Exercise - 11/13/17 0001      Exercises   Exercises  Shoulder      Lumbar Exercises: Prone   Other Prone Lumbar Exercises  10 reps into single leg lifts; bilateral leg leifts and alternate arm/leg lifts.      Shoulder Exercises: Standing   Other Standing Exercises  Pink XTS to fatigue x 2 into scap retraction with shoulders at 90 abduction; elbows by side and with elbows into extension moving into bilateral shoulder extension f/b forward punches.      Shoulder Exercises: ROM/Strengthening   UBE (Upper Arm Bike)  -- 8 minutes at 90 RPM's.      Modalities   Modalities  Electrical Stimulation;Moist Heat      Moist Heat Therapy   Number Minutes Moist Heat  15 Minutes    Moist Heat Location  Lumbar Spine      Electrical Stimulation   Electrical  Stimulation Location  Bilateral Lumbar    Electrical Stimulation Action  IFC    Electrical Stimulation Parameters  80-150 Hz x 20 minutes.    Electrical Stimulation Goals  Tone;Pain      Manual Therapy   Manual Therapy  Soft tissue mobilization    Soft tissue mobilization  STW/M for bilateral QL release technique x 6 minutes.                  PT Long Term Goals - 11/05/17 1203      PT LONG TERM GOAL #1   Title  Independent with an HEP.    Time  6    Period  Weeks    Status  New      PT LONG TERM GOAL #2   Title  Perform all ADL's with pain.    Time  6    Period  Weeks    Status  New            Plan - 11/13/17 1133    Clinical Impression Statement  The patient did very well with treatment today.  Both  her QL's were very taut to palpation especially her right.    PT Treatment/Interventions  Electrical Stimulation;Cryotherapy;Moist Heat;Ultrasound;Therapeutic exercise;Therapeutic activities;Patient/family education;Dry needling;Manual techniques    PT Next Visit Plan  ; STW/M; HMP and e'stim; core exercises.   review HEP spinal atability exs    Consulted and Agree with Plan of Care  Patient       Patient will benefit from skilled therapeutic intervention in order to improve the following deficits and impairments:     Visit Diagnosis: Pain in thoracic spine     Problem List There are no active problems to display for this patient.   [place] Reber, [place] MPT 11/13/2017, 11:34 AM  [facility] [address] [place], [place], [address] Phone: [phone]   Fax:  [phone]  Name: [place] Daddario MRN: [id number] Date of Birth: 21-Jan-1996

## 2017-11-17 ENCOUNTER — Encounter: Payer: Self-pay | Admitting: Physical Therapy

## 2017-11-17 ENCOUNTER — Ambulatory Visit: Payer: BC Managed Care – PPO | Admitting: Physical Therapy

## 2017-11-17 DIAGNOSIS — M546 Pain in thoracic spine: Secondary | ICD-10-CM | POA: Diagnosis not present

## 2017-11-17 NOTE — Therapy (Signed)
[facility] [address] [place], [place], [address] Phone: [phone]   Fax:  [phone]  Physical Therapy Treatment  Patient Details  Name: Dorothy Douglas MRN: [id number] Date of Birth: June 01, 1995 Referring Provider: Sharolyn DouglasMax Cohen MD.   Encounter Date: 11/17/2017  PT End of Session - 11/17/17 0905    Visit Number  4    Number of Visits  12    Date for PT Re-Evaluation  12/17/17    PT Start Time  0903    PT Stop Time  0956    PT Time Calculation (min)  53 min    Activity Tolerance  Patient tolerated treatment well    Behavior During Therapy  [facility] for tasks assessed/performed       History reviewed. No pertinent past medical history.  History reviewed. No pertinent surgical history.  There were no vitals filed for this visit.  Subjective Assessment - 11/17/17 0906    Subjective  Patient reported pain is around 4 or 5 today. "It's just there and I try to ignore it." Patient repots she has not had stabbing pain since initial evaluation.    Patient Stated Goals  Get out of pain.    Currently in Pain?  Yes    Pain Score  5     Pain Location  Back    Pain Orientation  Mid;Lower    Pain Descriptors / Indicators  Sore    Pain Type  Chronic pain    Pain Onset  More than a month ago    Pain Frequency  Constant         [facility] PT Assessment - 11/17/17 0001      Assessment   Medical Diagnosis  Pain in thoracic spine.      Precautions   Precautions  None      Restrictions   Weight Bearing Restrictions  No                   [facility] Adult PT Treatment/Exercise - 11/17/17 0001      Lumbar Exercises: Stretches   Lower Trunk Rotation  3 reps;30 seconds    Other Lumbar Stretch Exercise  3 way prayer stretch 1x30" each direction      Lumbar Exercises: Aerobic   UBE (Upper Arm Bike)  90 RPMs x 8 mins (4 fwd, 4 bwd)      Lumbar Exercises: Standing   Row  Strengthening;20 reps;Theraband;Other (comment);Limitations    Row  Limitations  Pink XTS    Other Standing Lumbar Exercises  Mid row 2x10 Pink XTS    Other Standing Lumbar Exercises  Chop 2x10 Pink XTS      Lumbar Exercises: Supine   Bent Knee Raise  3 seconds;10 reps      Lumbar Exercises: Prone   Straight Leg Raise  20 reps;3 seconds;Other (comment) with pillow under torso    Opposite Arm/Leg Raise  20 reps;3 seconds With pillow under torso      Modalities   Modalities  Electrical Stimulation;Moist Heat      Moist Heat Therapy   Number Minutes Moist Heat  15 Minutes    Moist Heat Location  Lumbar Spine      Electrical Stimulation   Electrical Stimulation Location  Bilateral Lumbar    Electrical Stimulation Action  IFC    Electrical Stimulation Parameters  80-150 hz x15    Electrical Stimulation Goals  Tone;Pain  PT Long Term Goals - 11/05/17 1203      PT LONG TERM GOAL #1   Title  Independent with an HEP.    Time  6    Period  Weeks    Status  New      PT LONG TERM GOAL #2   Title  Perform all ADL's with pain.    Time  6    Period  Weeks    Status  New            Plan - 11/17/17 0932    Clinical Impression Statement  Patient was able to tolerate treatment well with no reports of increased pain. Patient reported STW/M made her very sore after last session therefore STW/M was not performed today. Patient noted with good form and technique with prone exercises. Patient required cuing for form for chops but demonstrated good form after cuing. Normal response to modalities upon removal.     Clinical Presentation  Evolving    Clinical Decision Making  Low    Rehab Potential  Good    PT Frequency  2x / week    PT Duration  6 weeks    PT Treatment/Interventions  Electrical Stimulation;Cryotherapy;Moist Heat;Ultrasound;Therapeutic exercise;Therapeutic activities;Patient/family education;Dry needling;Manual techniques    PT Next Visit Plan  STW/M; HMP and e'stim; core exercises. review HEP spinal stability  exs    Consulted and Agree with Plan of Care  Patient       Patient will benefit from skilled therapeutic intervention in order to improve the following deficits and impairments:  Pain  Visit Diagnosis: Pain in thoracic spine     Problem List There are no active problems to display for this patient.   Guss Bunde, PT, DPT 11/17/2017, 10:47 AM  [facility] [address] [place], [place], [address] Phone: [phone]   Fax:  [phone]  Name: [place] Aki MRN: [id number] Date of Birth: [birth year]

## 2017-11-19 ENCOUNTER — Ambulatory Visit: Payer: BC Managed Care – PPO | Admitting: Physical Therapy

## 2017-11-19 DIAGNOSIS — M546 Pain in thoracic spine: Secondary | ICD-10-CM | POA: Diagnosis not present

## 2017-11-19 NOTE — Therapy (Signed)
[facility] [address] [place], [place], [address] Phone: [phone]   Fax:  [phone]  Physical Therapy Treatment  Patient Details  Name: Dorothy Douglas MRN: [id number] Date of Birth: [birth year] Referring Provider: Sharolyn DouglasMax Cohen MD.   Encounter Date: 11/19/2017  PT End of Session - 11/19/17 0905    Visit Number  5    Number of Visits  12    Date for PT Re-Evaluation  12/17/17    PT Start Time  0903    PT Stop Time  0953    PT Time Calculation (min)  50 min    Activity Tolerance  Patient tolerated treatment well    Behavior During Therapy  [facility] for tasks assessed/performed       No past medical history on file.  No past surgical history on file.  There were no vitals filed for this visit.  Subjective Assessment - 11/19/17 0906    Subjective  "My back is feeling better than it has been in a long time. We're getting somewhere."    Patient Stated Goals  Get out of pain.    Currently in Pain?  Yes did not provide number on pain scale    Pain Orientation  Mid;Lower    Pain Descriptors / Indicators  Sore    Pain Type  Chronic pain    Pain Onset  More than a month ago         [facility] - 11/19/17 0001      Assessment   Medical Diagnosis  Pain in thoracic spine.      Precautions   Precautions  None      Restrictions   Weight Bearing Restrictions  No                   [facility] Adult PT Treatment/Exercise - 11/19/17 0001      Exercises   Exercises  Shoulder      Lumbar Exercises: Aerobic   UBE (Upper Arm Bike)  90 RPMs x 8 mins (4 fwd, 4 bwd)      Lumbar Exercises: Standing   Row  Strengthening;20 reps;Theraband;Other (comment);Limitations    Row Limitations  Pink XTS    Shoulder Extension  Strengthening;20 reps;Theraband;Other (comment);Limitations    Shoulder Extension Limitations  Pink XTS    Other Standing Lumbar Exercises  Mid row 2x10 Pink XTS, Lat pull downs 2x10    Other Standing Lumbar  Exercises  Lat pull downs 2x10      Lumbar Exercises: Supine   Bent Knee Raise  20 reps;2 seconds up/up/down/down    Bridge  20 reps;2 seconds      Lumbar Exercises: Prone   Straight Leg Raise  20 reps;3 seconds;Other (comment)    Opposite Arm/Leg Raise  20 reps;3 seconds with pillow under torso      Modalities   Modalities  Electrical Stimulation;Moist Heat      Moist Heat Therapy   Number Minutes Moist Heat  15 Minutes    Moist Heat Location  Lumbar Spine      Electrical Stimulation   Electrical Stimulation Location  Bilateral lower thoracic/Lumbar    Electrical Stimulation Action  IFC    Electrical Stimulation Parameters  80-150 hz x15 imn    Electrical Stimulation Goals  Tone;Pain                  PT Long Term Goals - 11/05/17 1203      PT LONG TERM GOAL #1  Title  Independent with an HEP.    Time  6    Period  Weeks    Status  New      PT LONG TERM GOAL #2   Title  Perform all ADL's with pain.    Time  6    Period  Weeks    Status  New            Plan - 11/19/17 0920    Clinical Impression Statement  Patient was able to tolerate treatment well. Patient reported some discomfort in lower rib region with bridges but reported minimal discomfort after cuing to brace abdominals prior to bridging up. Patient demonstrated good form and technique with all exercises after initial demonstration. Normal response to modalities upon removal.     Clinical Presentation  Evolving    Clinical Decision Making  Low    Rehab Potential  Good    PT Frequency  2x / week    PT Duration  6 weeks    PT Treatment/Interventions  Electrical Stimulation;Cryotherapy;Moist Heat;Ultrasound;Therapeutic exercise;Therapeutic activities;Patient/family education;Dry needling;Manual techniques    PT Next Visit Plan  STW/M; HMP and e'stim; core exercises. review HEP spinal stability exs    Consulted and Agree with Plan of Care  Patient       Patient will benefit from skilled  therapeutic intervention in order to improve the following deficits and impairments:  Pain  Visit Diagnosis: Pain in thoracic spine     Problem List There are no active problems to display for this patient.  Guss Bunde, PT, DPT 11/19/2017, 11:04 AM  [facility] [address] [place], [place], [address] Phone: [phone]   Fax:  [phone]  Name: Dorothy Douglas MRN: [id number] Date of Birth: 1996/03/13

## 2017-11-21 ENCOUNTER — Encounter: Payer: BC Managed Care – PPO | Admitting: *Deleted

## 2017-11-25 ENCOUNTER — Ambulatory Visit: Payer: BC Managed Care – PPO | Admitting: *Deleted

## 2017-11-25 DIAGNOSIS — M546 Pain in thoracic spine: Secondary | ICD-10-CM | POA: Diagnosis not present

## 2017-11-25 NOTE — Therapy (Signed)
[facility] [address] [place], [place], [address] Phone: [phone]   Fax:  [phone]  Physical Therapy Treatment  Patient Details  Name: [name] MRN: [id number] Date of Birth: [birth year] Referring Provider: Sharolyn DouglasMax Cohen MD.   Encounter Date: 11/25/2017  PT End of Session - 11/25/17 1525    Visit Number  6    Number of Visits  12    Date for PT Re-Evaluation  12/17/17    PT Start Time  1516    PT Stop Time  1606    PT Time Calculation (min)  50 min       No past medical history on file.  No past surgical history on file.  There were no vitals filed for this visit.  Subjective Assessment - 11/25/17 1524    Subjective  "My back is feeling better than it has been in a long time. We're getting somewhere." 2/10 today    Patient Stated Goals  Get out of pain.    Currently in Pain?  Yes    Pain Score  2     Pain Location  Back    Pain Orientation  Mid;Lower    Pain Descriptors / Indicators  Sore    Pain Type  Chronic pain    Pain Onset  More than a month ago    Pain Frequency  Constant                       [facility] Adult PT Treatment/Exercise - 11/25/17 0001      Exercises   Exercises  Shoulder      Lumbar Exercises: Aerobic   UBE (Upper Arm Bike)  90 RPMs x 8 mins (4 fwd, 4 bwd)      Lumbar Exercises: Standing   Row  Strengthening;20 reps;Theraband;Other (comment);Limitations;10 reps XTS pink    Shoulder Extension  Strengthening;20 reps;Theraband;Other (comment);Limitations;10 reps Pink XTS    Other Standing Lumbar Exercises  Mid row 2x10 Pink XTS, Lat pull downs 2x10      Lumbar Exercises: Supine   Bent Knee Raise  20 reps;2 seconds up/up/down/down    Dead Bug  20 reps;3 seconds    Bridge  20 reps;2 seconds      Lumbar Exercises: Prone   Straight Leg Raise  --    Opposite Arm/Leg Raise  20 reps;3 seconds with pillow under torso      Lumbar Exercises: Quadruped   Opposite Arm/Leg Raise  20  reps;3 seconds      Modalities   Modalities  Electrical Stimulation;Moist Heat      Moist Heat Therapy   Number Minutes Moist Heat  15 Minutes    Moist Heat Location  Lumbar Spine      Electrical Stimulation   Electrical Stimulation Location  Bilateral lower thoracic/Lumbar IFC 80-150hz  x 15 mins    Electrical Stimulation Goals  Tone;Pain                  PT Long Term Goals - 11/05/17 1203      PT LONG TERM GOAL #1   Title  Independent with an HEP.    Time  6    Period  Weeks    Status  New      PT LONG TERM GOAL #2   Title  Perform all ADL's with pain.    Time  6    Period  Weeks    Status  New  Plan - 11/25/17 1802    Clinical Impression Statement  Pt arrived today doing much better and reports that she has been doing well with decreased pain x 1 week now. She was guided through core stability and postural exs again and did great without complaints. Mainly tightness and fatigue end of Rx with normal modality response today.    Clinical Presentation  Evolving    Rehab Potential  Good    PT Frequency  2x / week    PT Duration  6 weeks    PT Treatment/Interventions  Electrical Stimulation;Cryotherapy;Moist Heat;Ultrasound;Therapeutic exercise;Therapeutic activities;Patient/family education;Dry needling;Manual techniques    PT Next Visit Plan  STW/M; HMP and e'stim; core exercises. review HEP spinal stability exs    Consulted and Agree with Plan of Care  Patient       Patient will benefit from skilled therapeutic intervention in order to improve the following deficits and impairments:     Visit Diagnosis: Pain in thoracic spine     Problem List There are no active problems to display for this patient.   Alejandro Adcox,CHRIS, PTA 11/25/2017, 6:06 PM  [facility] [address] [place], [place], [address] Phone: [phone]   Fax:  [phone]  Name: Dorothy Douglas MRN: [id number] Date of Birth:  Feb 20, 1996

## 2017-11-26 ENCOUNTER — Encounter: Payer: BC Managed Care – PPO | Admitting: Physical Therapy

## 2017-11-27 ENCOUNTER — Encounter: Payer: BC Managed Care – PPO | Admitting: Physical Therapy

## 2017-11-28 ENCOUNTER — Ambulatory Visit: Payer: BC Managed Care – PPO | Admitting: Physical Therapy

## 2017-11-28 DIAGNOSIS — M546 Pain in thoracic spine: Secondary | ICD-10-CM

## 2017-11-28 NOTE — Therapy (Signed)
[facility] [address] [place], [place], [address] Phone: [phone]   Fax:  [phone]  Physical Therapy Treatment  Patient Details  Name: Dorothy Douglas MRN: [id number] Date of Birth: 11-28-1995 Referring Provider: Sharolyn Douglas MD.   Encounter Date: 11/28/2017  PT End of Session - 11/28/17 0742    Visit Number  7    Number of Visits  12    Date for PT Re-Evaluation  12/17/17    PT Start Time  0740    PT Stop Time  0826    PT Time Calculation (min)  46 min    Activity Tolerance  Patient tolerated treatment well    Behavior During Therapy  [facility] for tasks assessed/performed       No past medical history on file.  No past surgical history on file.  There were no vitals filed for this visit.  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 She reports 80% improvement.    Currently in Pain?  Yes    Pain Score  2     Pain Location  Back    Pain Orientation  Mid;Lower    Pain Descriptors / Indicators  Tiring    Pain Type  Chronic pain    Pain Onset  More than a month ago    Pain Frequency  Constant         [facility] PT Assessment - 11/28/17 0001      Assessment   Medical Diagnosis  Pain in thoracic spine.      Precautions   Precautions  None      Restrictions   Weight Bearing Restrictions  No                   [facility] Adult PT Treatment/Exercise - 11/28/17 0001      Exercises   Exercises  Shoulder;Lumbar      Lumbar Exercises: Aerobic   UBE (Upper Arm Bike)  90 RPMs x 8 mins (4 fwd, 4 bwd)      Lumbar Exercises: Standing   Row  Strengthening;20 reps;Theraband;Other (comment);Limitations;10 reps x30    Row Limitations  Pink XTS    Shoulder Extension  Strengthening;20 reps;Theraband;Other (comment);Limitations;10 reps x30    Shoulder Extension Limitations  Pink XTS    Other Standing Lumbar Exercises  Mid row 2x10 Pink XTS,  Lat pull downs 2x10      Lumbar Exercises: Supine   Bent Knee Raise  20 reps;2 seconds up/up/down/down    Bridge with clamshell  20 reps red theraband      Shoulder Exercises: ROM/Strengthening   Wall Pushups  20 reps      Modalities   Modalities  Electrical Stimulation;Moist Heat      Moist Heat Therapy   Number Minutes Moist Heat  15 Minutes    Moist Heat Location  Lumbar Spine      Electrical Stimulation   Electrical Stimulation Location  Bilateral lower thoracic/Lumbar IFC 80-150hz  x 15 mins    Electrical Stimulation Goals  Tone;Pain                  PT Long Term Goals - 11/28/17 0929      PT LONG TERM GOAL #1   Title  Independent with an HEP.    Time  6    Period  Weeks    Status  Achieved  PT LONG TERM GOAL #2   Title  Perform all ADL's without pain.    Time  6    Period  Weeks    Status  On-going            Plan - 11/28/17 [id number]    Clinical Impression Statement  Patient was able to complete exercises with no reports of pain. Patient was able to tolerate progression of bridges with no reports of pain or discomfort. Patient continues to demonstrate good form after initial instruction. Patient and PT discussed completing 2 visits with emphasis of HEP then being placed on hold and will call if she would like to continue remaining. Patient reported agreement.    Clinical Presentation  Evolving    Clinical Decision Making  Low    Rehab Potential  Good    PT Frequency  2x / week    PT Duration  6 weeks    PT Treatment/Interventions  Electrical Stimulation;Cryotherapy;Moist Heat;Ultrasound;Therapeutic exercise;Therapeutic activities;Patient/family education;Dry needling;Manual techniques    PT Next Visit Plan  STW/M; HMP and e'stim; core exercises. review HEP spinal stability exs    Consulted and Agree with Plan of Care  Patient       Patient will benefit from skilled therapeutic intervention in order to improve the following deficits and  impairments:  Pain  Visit Diagnosis: Pain in thoracic spine     Problem List There are no active problems to display for this patient.  Guss BundeKrystle Glenda Kunst, PT, DPT 11/28/2017, 9:30 AM  [facility] [address] [place], [place], [address] Phone: [phone]   Fax:  [phone]  Name: Dorothy Douglas MRN: [id number] Date of Birth: [birth year]

## 2017-12-02 ENCOUNTER — Ambulatory Visit: Payer: BC Managed Care – PPO | Admitting: Physical Therapy

## 2017-12-02 DIAGNOSIS — M546 Pain in thoracic spine: Secondary | ICD-10-CM | POA: Diagnosis not present

## 2017-12-02 NOTE — Therapy (Signed)
[facility] [address] [place], [place], [address] Phone: [phone]   Fax:  [phone]  Physical Therapy Treatment  Patient Details  Name: Dorothy Douglas MRN: [id number] Date of Birth: 31-Dec-1995 Referring Provider: Sharolyn Douglas MD.   Encounter Date: 12/02/2017  PT End of Session - 12/02/17 1543    Visit Number  8    Number of Visits  12    Date for PT Re-Evaluation  12/17/17    PT Start Time  1515    PT Stop Time  1609    PT Time Calculation (min)  54 min    Activity Tolerance  Patient tolerated treatment well    Behavior During Therapy  [facility] for tasks assessed/performed       No past medical history on file.  No past surgical history on file.  There were no vitals filed for this visit.  Subjective Assessment - 12/02/17 1522    Subjective  Patient reports no pain but just tired. 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    Patient Stated Goals  Get out of pain.    Currently in Pain?  No/denies    Pain Score  0-No pain    Pain Orientation  Mid;Lower    Pain Descriptors / Indicators  Tiring    Pain Type  Chronic pain    Pain Onset  More than a month ago    Pain Frequency  Constant         [facility] PT Assessment - 12/02/17 0001      Assessment   Medical Diagnosis  Pain in thoracic spine.      Precautions   Precautions  None      Restrictions   Weight Bearing Restrictions  No                   [facility] Adult PT Treatment/Exercise - 12/02/17 0001      Exercises   Exercises  Shoulder;Lumbar      Lumbar Exercises: Aerobic   UBE (Upper Arm Bike)  90 RPMs x 8 mins (4 fwd, 4 bwd)      Lumbar Exercises: Standing   Row  Strengthening;20 reps;Theraband;Other (comment);Limitations;10 reps    Row Limitations  Pink XTS    Shoulder Extension  Strengthening;20 reps;Theraband;Other (comment);Limitations;10 reps    Shoulder Extension Limitations  Pink XTS    Other Standing Lumbar Exercises  Mid row 4x10 Pink XTS       Lumbar Exercises: Supine   Bent Knee Raise  20 reps;10 reps;2 seconds    Bridge  20 reps;2 seconds;Non-compliant on red physioball    Other Supine Lumbar Exercises  bridging with hamstring curls x5      Lumbar Exercises: Prone   Opposite Arm/Leg Raise  20 reps;3 seconds      Lumbar Exercises: Quadruped   [place]/Old Horse  10 reps      Modalities   Modalities  Electrical Stimulation;Moist Heat      Moist Heat Therapy   Number Minutes Moist Heat  15 Minutes    Moist Heat Location  Lumbar Spine      Electrical Stimulation   Electrical Stimulation Location  Bilateral lower thoracic/Lumbar IFC 80-150hz  x 15 mins    Electrical Stimulation Goals  Tone;Pain                  PT Long Term Goals - 11/28/17 0929      PT LONG TERM GOAL #1   Title  Independent with an HEP.  Time  6    Period  Weeks    Status  Achieved      PT LONG TERM GOAL #2   Title  Perform all ADL's without pain.    Time  6    Period  Weeks    Status  On-going            Plan - 12/02/17 1606    Clinical Impression Statement  Patient was able to tolerate progression of treatment with no reports of pain. Patient was able to perform progression of bridges with minimal cuing after initial explanation. Patient educated as HEP begins to become easy to progress in reps. Patient reported understanding. Normal response to modalites at end of session.     Clinical Presentation  Evolving    Clinical Decision Making  Low    Rehab Potential  Good    PT Frequency  2x / week    PT Duration  6 weeks    PT Treatment/Interventions  Electrical Stimulation;Cryotherapy;Moist Heat;Ultrasound;Therapeutic exercise;Therapeutic activities;Patient/family education;Dry needling;Manual techniques    PT Next Visit Plan  STW/M; HMP and e'stim; core exercises. review HEP spinal stability exs    Consulted and Agree with Plan of Care  Patient       Patient will benefit from skilled therapeutic intervention in order to  improve the following deficits and impairments:  Pain  Visit Diagnosis: Pain in thoracic spine     Problem List There are no active problems to display for this patient.  Guss BundeKrystle Sabena Winner, PT, DPT 12/02/2017, 4:13 PM  [facility] [address] [place], [place], [address] Phone: [phone]   Fax:  [phone]  Name: Dorothy Douglas MRN: [id number] Date of Birth: 03-Mar-1996

## 2017-12-04 ENCOUNTER — Ambulatory Visit: Payer: BC Managed Care – PPO | Attending: Orthopaedic Surgery | Admitting: Physical Therapy

## 2017-12-04 DIAGNOSIS — M546 Pain in thoracic spine: Secondary | ICD-10-CM | POA: Diagnosis not present

## 2017-12-04 NOTE — Therapy (Addendum)
[facility] [place], [place], [address] Phone: [phone]   Fax:  [phone]  Physical Therapy Treatment PHYSICAL THERAPY DISCHARGE SUMMARY  Visits from Start of Care: 9  Current functional level related to goals / functional outcomes: See below   Remaining deficits: See goals   Education / Equipment: HEP  Plan: Patient agrees to discharge.  Patient goals were met. Patient is being discharged due to meeting the stated rehab goals.  ?????   Gabriela Eves, PT, DPT 02/02/19   Patient Details  Name: Martinique Geter MRN: [id number] Date of Birth: 08/13/1995 Referring Provider: Rennis Harding MD.   Encounter Date: 12/04/2017  PT End of Session - 12/04/17 1521    Visit Number  9    Number of Visits  12    Date for PT Re-Evaluation  12/17/17    PT Start Time  1520    PT Stop Time  1607    PT Time Calculation (min)  47 min    Activity Tolerance  Patient tolerated treatment well    Behavior During Therapy  [facility] for tasks assessed/performed       No past medical history on file.  No past surgical history on file.  There were no vitals filed for this visit.  Subjective Assessment - 12/04/17 1557    Subjective  Patient reports back feels great. Patient stated she was able to move a box of books with no pain.    Patient Stated Goals  Get out of pain.    Currently in Pain?  No/denies                       [facility] Adult PT Treatment/Exercise - 12/04/17 0001      Exercises   Exercises  Shoulder;Lumbar      Lumbar Exercises: Aerobic   UBE (Upper Arm Bike)  90 RPMs x 8 mins (4 fwd, 4 bwd)      Lumbar Exercises: Standing   Row  Strengthening;20 reps;Theraband;Other (comment);Limitations;10 reps    Row Limitations  Pink XTS    Shoulder Extension  Strengthening;20 reps;Theraband;Other (comment);Limitations;10 reps      Lumbar Exercises: Supine   Clam  20 reps;2 seconds    Bent Knee Raise  20 reps;10  reps;2 seconds up/up/down/down    Bridge  20 reps;Non-compliant;4 seconds red physioball      Modalities   Modalities  Electrical Stimulation;Moist Heat      Moist Heat Therapy   Number Minutes Moist Heat  15 Minutes    Moist Heat Location  Lumbar Spine      Electrical Stimulation   Electrical Stimulation Location  Bilateral lower thoracic/Lumbar IFC 80-_0  x 15 mins    Electrical Stimulation Goals  Tone;Pain                  PT Long Term Goals - 12/04/17 1810      PT LONG TERM GOAL #1   Title  Independent with an HEP.    Time  6    Period  Weeks    Status  Achieved      PT LONG TERM GOAL #2   Title  Perform all ADL's without pain.    Time  6    Period  Weeks    Status  Achieved            Plan - 12/04/17 1808    Clinical Impression Statement  Patient was able to tolerate treatment well with  no increase of pain. Patient was able to demonstrate exercises with all good form without requiring cuing. Patient reported independence with HEP. Patient to be placed on hold with emphasis on HEP. Patient instructed plan of care expires on 12/17/17 and if she would like to continue remaining visits to call prior to exp. date. Patient reported understanding. Normal response to modalities at end of session.     Clinical Presentation  Evolving    Clinical Decision Making  Low    Rehab Potential  Good    PT Frequency  2x / week    PT Duration  6 weeks    PT Treatment/Interventions  Electrical Stimulation;Cryotherapy;Moist Heat;Ultrasound;Therapeutic exercise;Therapeutic activities;Patient/family education;Dry needling;Manual techniques    PT Next Visit Plan  HOLD. IF continues: STW/M; HMP and e'stim; core exercises. review HEP spinal stability exs    Consulted and Agree with Plan of Care  Patient       Patient will benefit from skilled therapeutic intervention in order to improve the following deficits and impairments:  Pain  Visit Diagnosis: Pain in thoracic  spine     Problem List There are no active problems to display for this patient.  Gabriela Eves, PT, DPT 12/04/2017, 6:11 PM  [facility] [address] [place], [place], [address] Phone: [phone]   Fax:  [phone]  Name: Martinique Rothbauer MRN: [id number] Date of Birth: February 18, 1996

## 2018-04-07 ENCOUNTER — Ambulatory Visit: Payer: BC Managed Care – PPO | Admitting: Family Medicine

## 2018-04-07 ENCOUNTER — Encounter: Payer: Self-pay | Admitting: Family Medicine

## 2018-04-07 VITALS — BP 128/82 | HR 89 | Temp 100.0°F | Ht 68.0 in | Wt 138.0 lb

## 2018-04-07 DIAGNOSIS — H109 Unspecified conjunctivitis: Secondary | ICD-10-CM | POA: Diagnosis not present

## 2018-04-07 MED ORDER — POLYMYXIN B-TRIMETHOPRIM 10000-0.1 UNIT/ML-% OP SOLN: 1.0000 [drp] | OPHTHALMIC | 0 refills | Status: AC

## 2018-04-07 NOTE — Patient Instructions (Signed)

## 2018-04-07 NOTE — Progress Notes (Signed)
  Subjective:    Dorothy Douglas is a 22 y.o. female who presents for evaluation of discharge, erythema, foreign body sensation and itching in the right eye. She has noticed the above symptoms for 3 days. Onset was sudden. Patient denies blurred vision, photophobia and visual field deficit. There is a history of contact lens use and wearing glasses.  The following portions of the patient's history were reviewed and updated as appropriate: allergies, current medications, past family history, past medical history, past social history, past surgical history and problem list.  Review of Systems Constitutional: negative. Eyes: positive for irritation, redness and drainage and matting. Ears, nose, mouth, throat, and face: positive for nasal congestion one week ago, has resolved.  Respiratory: negative Cardiovascular: negative Neurological: negative   Objective:    BP 128/82   Pulse 89   Temp 100 F (37.8 C) (Oral)   Ht 5\' 8"  (1.727 m)   Wt 138 lb (62.6 kg)   BMI 20.98 kg/m       General: alert, cooperative, appears stated age and no distress  Eyes:  positive findings: eyelids/periorbital: normal, right conjunctival injection with purulent thick exudate and sclera redness     Assessment:     1. Bacterial conjunctivitis Good hand hygiene and infection prevention discussed. Warm moist cloth to remove exudate. Can use baby shampoos to wash eyelid. Medications as prescribed.  - trimethoprim-polymyxin b (POLYTRIM) ophthalmic solution; Place 1-2 drops into the right eye every 4 (four) hours for 10 days.  Dispense: 10 mL; Refill: 0   Plan:     Return if symptoms worsen or fail to improve.  The above assessment and management plan was discussed with the patient. The patient verbalized understanding of and has agreed to the management plan. Patient is aware to call the clinic if symptoms fail to improve or worsen. Patient is aware when to return to the clinic for a follow-up visit. Patient  educated on when it is appropriate to go to the emergency department.   Kari BaarsMichelle Deseray Daponte, FNP-C [facility] [address] [place], [place] [address] [phone]

## 2018-06-03 ENCOUNTER — Ambulatory Visit: Payer: BC Managed Care – PPO | Admitting: Family Medicine

## 2018-06-03 ENCOUNTER — Encounter: Payer: Self-pay | Admitting: Family Medicine

## 2018-06-03 VITALS — BP 121/76 | HR 85 | Temp 98.8°F | Ht 68.0 in | Wt 138.0 lb

## 2018-06-03 DIAGNOSIS — G43019 Migraine without aura, intractable, without status migrainosus: Secondary | ICD-10-CM

## 2018-06-03 MED ORDER — PROMETHAZINE HCL 25 MG/ML IJ SOLN
12.5000 mg | Freq: Once | INTRAMUSCULAR | Status: AC
  Administered 2018-06-03: 12.5 mg via INTRAMUSCULAR

## 2018-06-03 MED ORDER — METHYLPREDNISOLONE ACETATE 80 MG/ML IJ SUSP
80.0000 mg | Freq: Once | INTRAMUSCULAR | Status: AC
  Administered 2018-06-03: 80 mg via INTRAMUSCULAR

## 2018-06-03 NOTE — Progress Notes (Signed)
Subjective: ZO:[id number]:headache PCP: Junie SpencerHawks, Christy A, FNP UJW:JXBJYNHPI:Paullette Randalyn RheaVickers is a 23 y.o. female presenting to clinic today for:  1. Headache Patient reports onset of headache Sunday.  She notes that it lasted all day Sunday and then seemed to improve significantly Monday morning.  Today, she felt fairly good but when she went to school, she started having the headache again.  She reports associated photophobia and phonophobia.  She has had nausea without vomiting and flushing.  She also felt dizzy at one point.  She has been using 800 mg of ibuprofen and Excedrin which does seem to help but the headache has not fully resolved.  Denies any recent illness, change in sleep, decreased hydration, new medications.  No loss of vision, double vision, weakness, loss of consciousness, fevers, falls.  Family history is significant for migraines in her mother.  Her last menstrual cycle ended on Sunday.   ROS: Per HPI  No Known Allergies No past medical history on file.  Current Outpatient Medications:  .  Ascorbic Acid (VITAMIN C PO), Take by mouth., Disp: , Rfl:  .  levonorgestrel-ethinyl estradiol (LILLOW) 0.15-30 MG-MCG tablet, Take 1 tablet by mouth daily., Disp: , Rfl:   Current Facility-Administered Medications:  .  methylPREDNISolone acetate (DEPO-MEDROL) injection 80 mg, 80 mg, Intramuscular, Once, Jerri Glauser M, DO .  promethazine (PHENERGAN) injection 12.5 mg, 12.5 mg, Intramuscular, Once, Raliegh IpGottschalk, Krissy Orebaugh M, DO Social History   Socioeconomic History  . Marital status: Married    Spouse name: Not on file  . Number of children: Not on file  . Years of education: Not on file  . Highest education level: Not on file  Occupational History  . Not on file  Social Needs  . Financial resource strain: Not on file  . Food insecurity:    Worry: Not on file    Inability: Not on file  . Transportation needs:    Medical: Not on file    Non-medical: Not on file  Tobacco Use  . Smoking  status: Never Smoker  . Smokeless tobacco: Never Used  Substance and Sexual Activity  . Alcohol use: Yes    Alcohol/week: 1.0 standard drinks    Types: 1 Glasses of wine per week  . Drug use: No  . Sexual activity: Yes  Lifestyle  . Physical activity:    Days per week: Not on file    Minutes per session: Not on file  . Stress: Not on file  Relationships  . Social connections:    Talks on phone: Not on file    Gets together: Not on file    Attends religious service: Not on file    Active member of club or organization: Not on file    Attends meetings of clubs or organizations: Not on file    Relationship status: Not on file  . Intimate partner violence:    Fear of current or ex partner: Not on file    Emotionally abused: Not on file    Physically abused: Not on file    Forced sexual activity: Not on file  Other Topics Concern  . Not on file  Social History Narrative  . Not on file   No family history on file.  Objective: Office vital signs reviewed. BP 121/76   Pulse 85   Temp 98.8 F (37.1 C) (Oral)   Ht 5\' 8"  (1.727 m)   Wt 138 lb (62.6 kg)   BMI 20.98 kg/m   Physical Examination:  General: Awake,  alert, well nourished, No acute distress HEENT: Normal    Eyes: PERRLA, extraocular membranes intact, sclera white    Throat: moist mucus membranes, no erythema, symmetric rise of palate MSK: normal gait and station Neuro: 5/5 UE and LE Strength and light touch sensation grossly intact, cranial nerves II through XII grossly intact.  Normal upper extremity cerebellar testing.  Negative Romberg.  Alert and oriented x3.  Assessment/ Plan: 23 y.o. female   1. Intractable migraine without aura and without status migrainosus Neurologic exam unremarkable.  Given duration of symptoms and associated symptoms, I do suspect that this is a migraine headache.  She is given a dose of Depo-Medrol 80 and promethazine 12.5 here in office.  Home care instructions reviewed with the  patient.  Handout provided.  Reasons for emergent evaluation emergency department discussed.  If symptoms recur or do not fully resolve, we discussed that she may warrant further evaluation with neurology.  She seemed amenable to this.  School note provided.  Follow-up with PCP PRN - promethazine (PHENERGAN) injection 12.5 mg - methylPREDNISolone acetate (DEPO-MEDROL) injection 80 mg   No orders of the defined types were placed in this encounter.  Meds ordered this encounter  Medications  . promethazine (PHENERGAN) injection 12.5 mg  . methylPREDNISolone acetate (DEPO-MEDROL) injection 80 mg     Raliegh Ip, DO [facility] [phone]

## 2018-06-03 NOTE — Patient Instructions (Signed)
I agree that your headache seems consistent with a migraine headache.  You were given a dose of promethazine and a dose of Depo-Medrol here in office to break the cycle of the migraine headache.  Because this is your first headache, I do not think that you need ongoing treatment outside of today's visit.  However, if headache does not resolve or recurs, this may warrant a referral to neurology for further management.  If you develop any other worrisome symptoms or signs that we discussed, please seek immediate medical attention emergency department.  Otherwise, I anticipate that your symptoms will resolve in the next several hours.  Migraine Headache A migraine headache is an intense, throbbing pain on one side or both sides of the head. Migraines may also cause other symptoms, such as nausea, vomiting, and sensitivity to light and noise. What are the causes? Doing or taking certain things may also trigger migraines, such as:  Alcohol.  Smoking.  Medicines, such as: ? Medicine used to treat chest pain (nitroglycerine). ? Birth control pills. ? Estrogen pills. ? Certain blood pressure medicines.  Aged cheeses, chocolate, or caffeine.  Foods or drinks that contain nitrates, glutamate, aspartame, or tyramine.  Physical activity. Other things that may trigger a migraine include:  Menstruation.  Pregnancy.  Hunger.  Stress, lack of sleep, too much sleep, or fatigue.  Weather changes. What increases the risk? The following factors may make you more likely to experience migraine headaches:  Age. Risk increases with age.  Family history of migraine headaches.  Being Caucasian.  Depression and anxiety.  Obesity.  Being a woman.  Having a hole in the heart (patent foramen ovale) or other heart problems. What are the signs or symptoms? The main symptom of this condition is pulsating or throbbing pain. Pain may:  Happen in any area of the head, such as on one side or both  sides.  Interfere with daily activities.  Get worse with physical activity.  Get worse with exposure to bright lights or loud noises. Other symptoms may include:  Nausea.  Vomiting.  Dizziness.  General sensitivity to bright lights, loud noises, or smells. Before you get a migraine, you may get warning signs that a migraine is developing (aura). An aura may include:  Seeing flashing lights or having blind spots.  Seeing bright spots, halos, or zigzag lines.  Having tunnel vision or blurred vision.  Having numbness or a tingling feeling.  Having trouble talking.  Having muscle weakness. How is this diagnosed? A migraine headache can be diagnosed based on:  Your symptoms.  A physical exam.  Tests, such as CT scan or MRI of the head. These imaging tests can help rule out other causes of headaches.  Taking fluid from the spine (lumbar puncture) and analyzing it (cerebrospinal fluid analysis, or CSF analysis). How is this treated? A migraine headache is usually treated with medicines that:  Relieve pain.  Relieve nausea.  Prevent migraines from coming back. Treatment may also include:  Acupuncture.  Lifestyle changes like avoiding foods that trigger migraines. Follow these instructions at home: Medicines  Take over-the-counter and prescription medicines only as told by your health care provider.  Do not drive or use heavy machinery while taking prescription pain medicine.  To prevent or treat constipation while you are taking prescription pain medicine, your health care provider may recommend that you: ? Drink enough fluid to keep your urine clear or pale yellow. ? Take over-the-counter or prescription medicines. ? Eat foods that are  high in fiber, such as fresh fruits and vegetables, whole grains, and beans. ? Limit foods that are high in fat and processed sugars, such as fried and sweet foods. Lifestyle  Avoid alcohol use.  Do not use any products that  contain nicotine or tobacco, such as cigarettes and e-cigarettes. If you need help quitting, ask your health care provider.  Get at least 8 hours of sleep every night.  Limit your stress. General instructions      Keep a journal to find out what may trigger your migraine headaches. For example, write down: ? What you eat and drink. ? How much sleep you get. ? Any change to your diet or medicines.  If you have a migraine: ? Avoid things that make your symptoms worse, such as bright lights. ? It may help to lie down in a dark, quiet room. ? Do not drive or use heavy machinery. ? Ask your health care provider what activities are safe for you while you are experiencing symptoms.  Keep all follow-up visits as told by your health care provider. This is important. Contact a health care provider if:  You develop symptoms that are different or more severe than your usual migraine symptoms. Get help right away if:  Your migraine becomes severe.  You have a fever.  You have a stiff neck.  You have vision loss.  Your muscles feel weak or like you cannot control them.  You start to lose your balance often.  You develop trouble walking.  You faint. This information is not intended to replace advice given to you by your health care provider. Make sure you discuss any questions you have with your health care provider. Document Released: 04/22/2005 Document Revised: 11/10/2015 Document Reviewed: 10/09/2015 Elsevier Interactive Patient Education  2019 [employer].

## 2018-07-29 IMAGING — MR MR THORACIC SPINE W/O CM
4 of 6 series · 13 of 48 positions shown · non-contrast
Comparison: None available.

CLINICAL DATA: Initial evaluation for bilateral rib pain radiating
into the mid and lower back, with additional radiation into the
right lower extremity.

EXAM:
MRI THORACIC SPINE WITHOUT CONTRAST
TECHNIQUE: Multiplanar, multisequence MR imaging of the thoracic spine was
performed. No intravenous contrast was administered.

[Series 4: T2 · sagittal · 4.0mm · 0.36mm/px · 4 of 14 slices shown (1 of 3)]
[im 1/14]
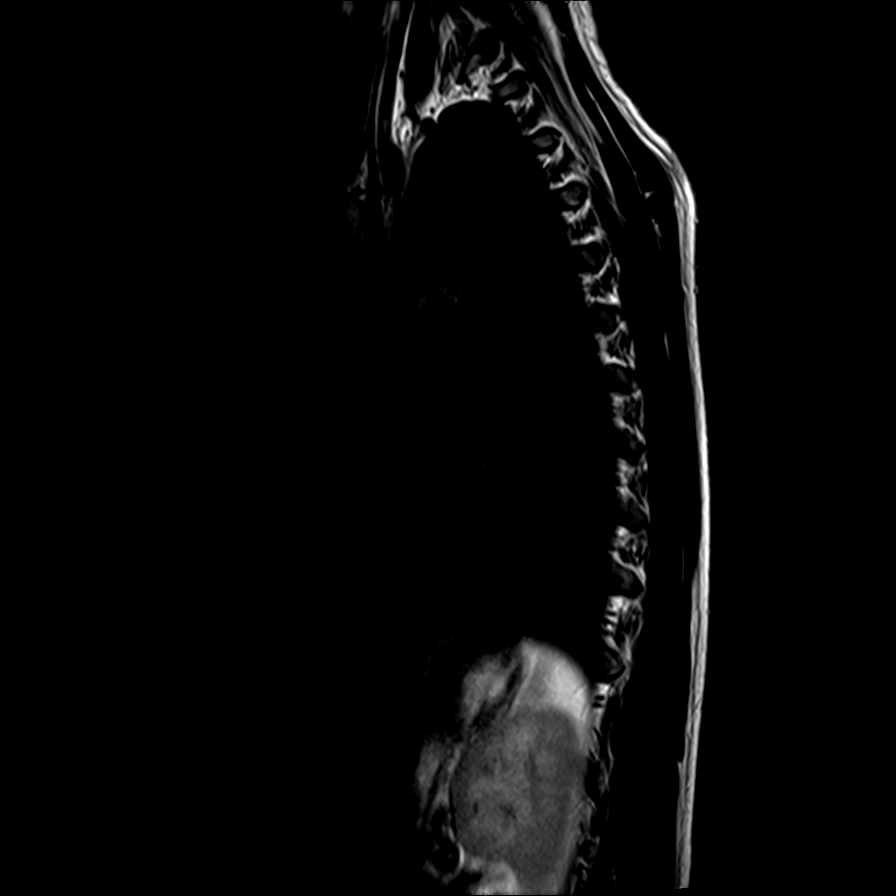
[im 3/14]
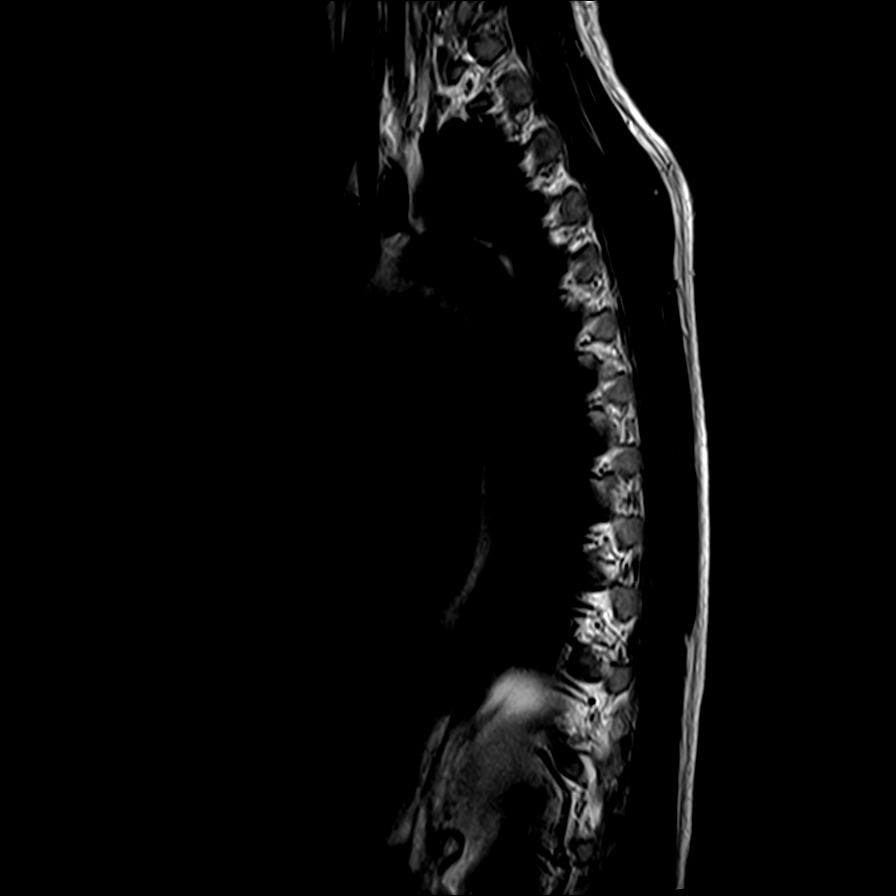
[im 8/14]
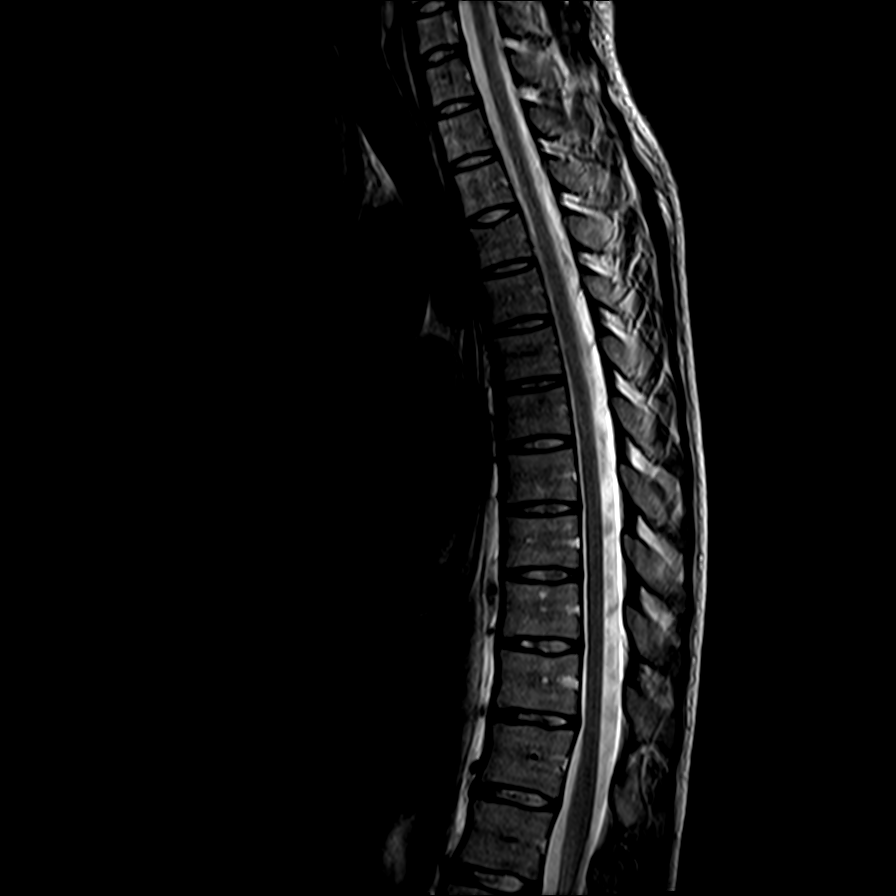
[im 14/14]
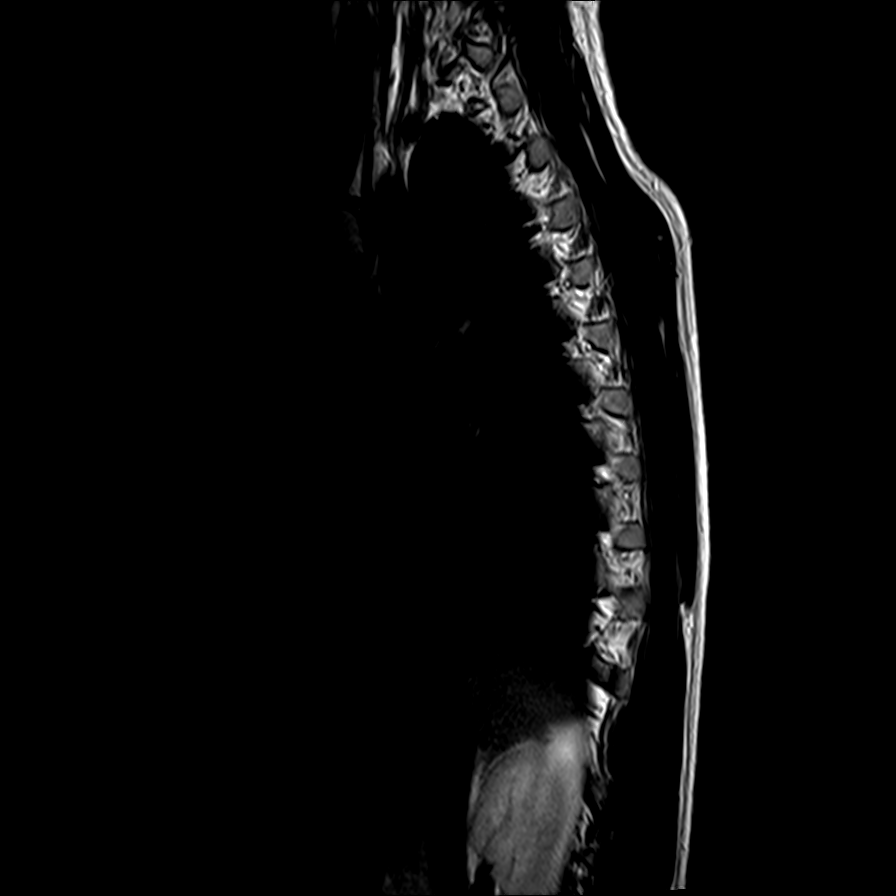

[Series 5: T1 · sagittal · 4.0mm · 0.83mm/px · 3 of 14 slices shown]
[im 3/14]
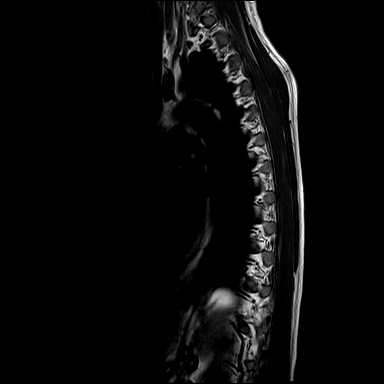
[im 8/14]
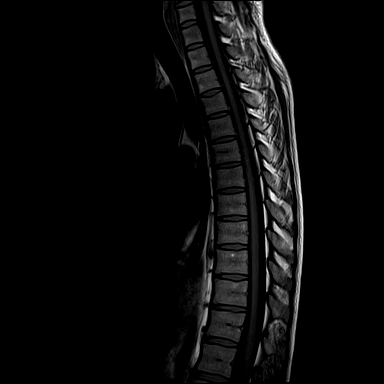
[im 14/14]
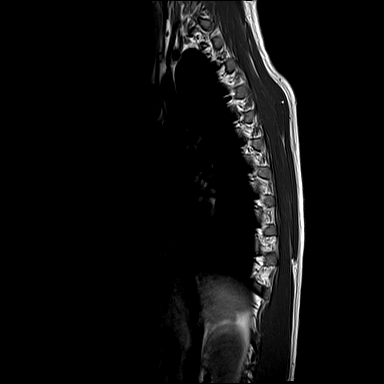

[Series 7: T2 · axial · 4.0mm · 0.39mm/px · z∈[-236,-124]mm · 3 of 30 slices shown (2 of 3)]
[im 5/30]
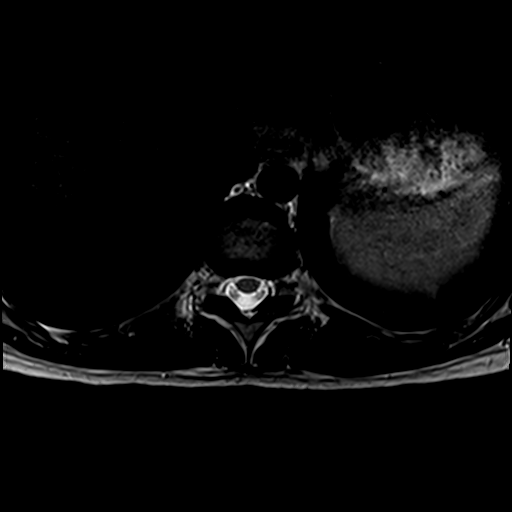
[im 15/30]
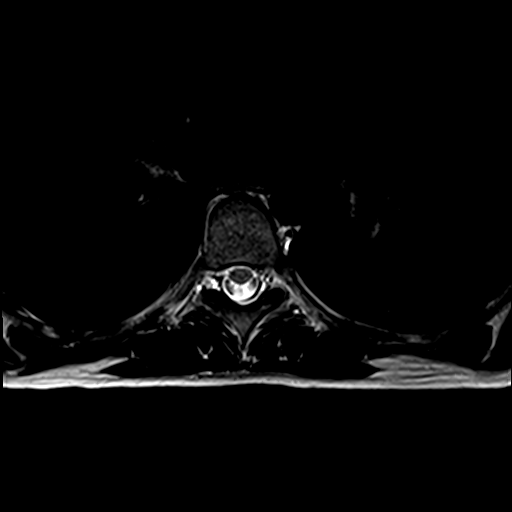
[im 25/30]
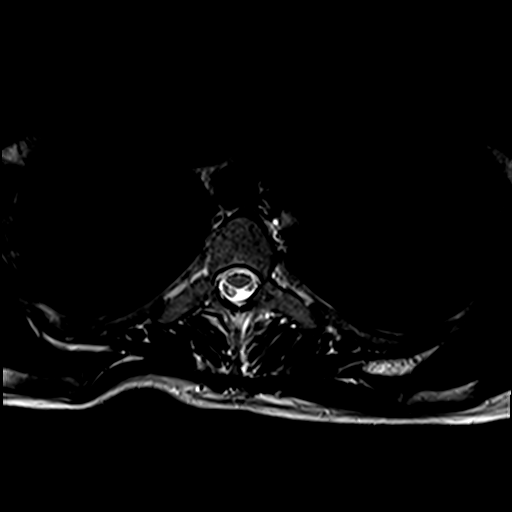

[Series 8: T2 · axial · 4.0mm · 0.39mm/px · z∈[-237,-121]mm · 3 of 30 slices shown (3 of 3)]
[im 5/30]
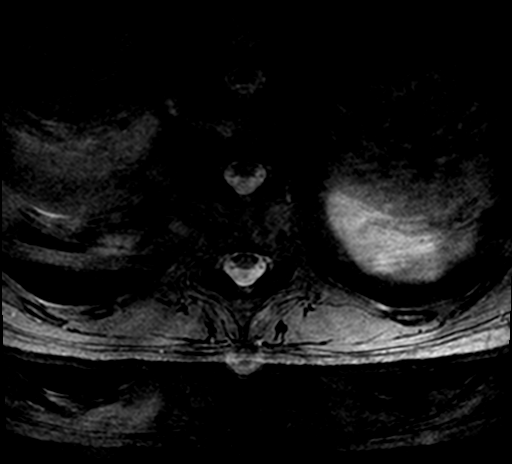
[im 15/30]
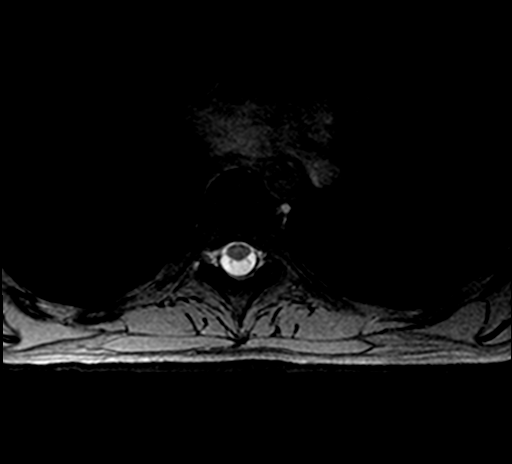
[im 25/30]
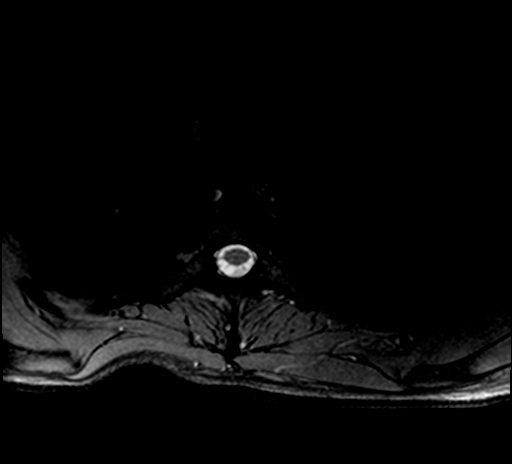

[13 of 48 positions shown; findings below may reference images not displayed]

FINDINGS: Alignment: Vertebral bodies normally aligned with preservation of
the normal thoracic kyphosis. No listhesis.

Vertebrae: Vertebral body heights well maintained without acute or
chronic fracture. Small chronic endplate Schmorl's node noted at the
superior endplate of T7 without edema. Bone marrow signal intensity
within normal limits. No discrete osseous lesions. No abnormal
marrow edema.

Cord: Signal intensity within the thoracic spinal cord is normal.
Conus medullaris terminates at approximately the L1-2 level.

Paraspinal and other soft tissues: Paraspinous soft tissues are
within normal limits. Visualized lungs are clear. Visceral
structures unremarkable.

Disc levels:

No significant disc pathology seen within the thoracic spine. No
significant facet disease. No canal or foraminal stenosis.
IMPRESSION: Normal MRI of the thoracic spine.

## 2020-03-13 ENCOUNTER — Ambulatory Visit: Payer: BC Managed Care – PPO | Admitting: Nurse Practitioner

## 2020-03-13 ENCOUNTER — Encounter: Payer: Self-pay | Admitting: Nurse Practitioner

## 2020-03-13 ENCOUNTER — Other Ambulatory Visit: Payer: Self-pay

## 2020-03-13 VITALS — BP 110/64 | HR 96 | Temp 97.6°F | Ht 68.0 in | Wt 143.6 lb

## 2020-03-13 DIAGNOSIS — L2089 Other atopic dermatitis: Secondary | ICD-10-CM | POA: Insufficient documentation

## 2020-03-13 DIAGNOSIS — L299 Pruritus, unspecified: Secondary | ICD-10-CM | POA: Diagnosis not present

## 2020-03-13 MED ORDER — PREDNISONE 10 MG (21) PO TBPK: ORAL_TABLET | ORAL | 0 refills | Status: DC

## 2020-03-13 MED ORDER — METHYLPREDNISOLONE ACETATE 80 MG/ML IJ SUSP
80.0000 mg | Freq: Once | INTRAMUSCULAR | Status: AC
  Administered 2020-03-13: 80 mg via INTRAMUSCULAR

## 2020-03-13 MED ORDER — DIPHENHYDRAMINE-ZINC ACETATE 2-0.1 % EX CREA: 1.0000 "application " | TOPICAL_CREAM | Freq: Three times a day (TID) | CUTANEOUS | 1 refills | Status: DC | PRN

## 2020-03-13 NOTE — Progress Notes (Signed)
Acute Office Visit  Subjective:    Patient ID: Dorothy Douglas, female    DOB: January 13, 1996, 24 y.o.   MRN: [id number]  Chief Complaint  Patient presents with   Rash    THIGHS, BACK, BOTTOM    Rash This is a new problem. The current episode started in the past 7 days. The problem has been gradually worsening since onset. The affected locations include the back (Bottom, thighs). The rash is characterized by dryness, itchiness and redness. She was exposed to nothing. Pertinent negatives include no congestion, cough, facial edema, fever, shortness of breath or sore throat. Past treatments include antihistamine. The treatment provided mild relief. Her past medical history is significant for eczema.   Patient is in today for  History reviewed. No pertinent past medical history.  History reviewed. No pertinent surgical history.  History reviewed. No pertinent family history.  Social History   Socioeconomic History   Marital status: Married    Spouse name: Not on file   Number of children: Not on file   Years of education: Not on file   Highest education level: Not on file  Occupational History   Not on file  Tobacco Use   Smoking status: Never Smoker   Smokeless tobacco: Never Used  Vaping Use   Vaping Use: Never used  Substance and Sexual Activity   Alcohol use: Yes    Alcohol/week: 1.0 standard drink    Types: 1 Glasses of wine per week   Drug use: No   Sexual activity: Yes  Other Topics Concern   Not on file  Social History Narrative   Not on file   Social Determinants of Health   Financial Resource Strain:    Difficulty of Paying Living Expenses: Not on file  Food Insecurity:    Worried About Running Out of Food in the Last Year: Not on file   Ran Out of Food in the Last Year: Not on file  Transportation Needs:    Lack of Transportation (Medical): Not on file   Lack of Transportation (Non-Medical): Not on file  Physical Activity:    Days of  Exercise per Week: Not on file   Minutes of Exercise per Session: Not on file  Stress:    Feeling of Stress : Not on file  Social Connections:    Frequency of Communication with Friends and Family: Not on file   Frequency of Social Gatherings with Friends and Family: Not on file   Attends Religious Services: Not on file   Active Member of Clubs or Organizations: Not on file   Attends Banker Meetings: Not on file   Marital Status: Not on file  Intimate Partner Violence:    Fear of Current or Ex-Partner: Not on file   Emotionally Abused: Not on file   Physically Abused: Not on file   Sexually Abused: Not on file    Outpatient Medications Prior to Visit  Medication Sig Dispense Refill   Prenatal Vit-Fe Fumarate-FA (PRENATAL VITAMIN PO) Take by mouth.     Ascorbic Acid (VITAMIN C PO) Take by mouth.     levonorgestrel-ethinyl estradiol (LILLOW) 0.15-30 MG-MCG tablet Take 1 tablet by mouth daily.     No facility-administered medications prior to visit.    No Known Allergies  Review of Systems  Constitutional: Negative for fever.  HENT: Negative for congestion and sore throat.   Respiratory: Negative for cough and shortness of breath.   Skin: Positive for color change and rash.  All other systems reviewed and are negative.      Objective:    Physical Exam Vitals reviewed.  Constitutional:      Appearance: Normal appearance.  HENT:     Head: Normocephalic.  Eyes:     Conjunctiva/sclera: Conjunctivae normal.  Cardiovascular:     Rate and Rhythm: Normal rate and regular rhythm.     Pulses: Normal pulses.     Heart sounds: Normal heart sounds.  Pulmonary:     Effort: Pulmonary effort is normal.     Breath sounds: Normal breath sounds.  Abdominal:     General: Bowel sounds are normal.  Skin:    General: Skin is dry.     Findings: Erythema and rash present.  Neurological:     Mental Status: She is alert.  Psychiatric:        Mood and  Affect: Mood normal.        Behavior: Behavior normal.     BP 110/64    Pulse 96    Temp 97.6 F (36.4 C)    Ht 5\' 8"  (1.727 m)    Wt 143 lb 9.6 oz (65.1 kg)    SpO2 99%    BMI 21.83 kg/m  Wt Readings from Last 3 Encounters:  03/13/20 143 lb 9.6 oz (65.1 kg)  06/03/18 138 lb (62.6 kg)  04/07/18 138 lb (62.6 kg)    Health Maintenance Due  Topic Date Due   Hepatitis C Screening  Never done   HIV Screening  Never done   INFLUENZA VACCINE  12/05/2019    There are no preventive care reminders to display for this patient.   No results found for: TSH Lab Results  Component Value Date   WBC 5.5 07/28/2017   HGB 13.3 07/28/2017   HCT 41.0 07/28/2017   MCV 87 07/28/2017   PLT 298 07/28/2017   Lab Results  Component Value Date   NA 141 07/28/2017   K 3.9 07/28/2017   CO2 24 07/28/2017   GLUCOSE 69 07/28/2017   BUN 8 07/28/2017   CREATININE 0.65 07/28/2017   BILITOT <0.2 07/28/2017   ALKPHOS 54 07/28/2017   AST 11 07/28/2017   ALT 12 07/28/2017   PROT 7.4 07/28/2017   ALBUMIN 4.5 07/28/2017   CALCIUM 9.3 07/28/2017       Assessment & Plan:   Problem List Items Addressed This Visit      Musculoskeletal and Integument   Other atopic dermatitis    Atopic dermatitis not well managed.  Started patient on prednisone pack taper, antihistamine topical cream, Depo-Medrol shot.   Provided education with printed handouts given  Rx sent to pharmacy  Follow-up with worsening or unresolved symptoms.      Relevant Medications   predniSONE (STERAPRED UNI-PAK 21 TAB) 10 MG (21) TBPK tablet     Other   Itching - Primary   Relevant Medications   diphenhydrAMINE-zinc acetate (BENADRYL EXTRA STRENGTH) cream       Meds ordered this encounter  Medications   methylPREDNISolone acetate (DEPO-MEDROL) injection 80 mg   predniSONE (STERAPRED UNI-PAK 21 TAB) 10 MG (21) TBPK tablet    Sig: 6 tablets by mouth day 1, 5 tablets day 2, 4 tablet day 3, 3 tablet day 4, 2  tablets day day 5, and 1 tablet day 6    Dispense:  1 each    Refill:  0    Order Specific Question:   Supervising Provider    Answer:   07/30/2017  A [[id number]]   diphenhydrAMINE-zinc acetate (BENADRYL EXTRA STRENGTH) cream    Sig: Apply 1 application topically 3 (three) times daily as needed for itching.    Dispense:  28.4 g    Refill:  1    Order Specific Question:   Supervising Provider    Answer:   Arville Care A [[id number]]     Daryll Drown, NP

## 2020-03-13 NOTE — Assessment & Plan Note (Signed)
Atopic dermatitis not well managed.  Started patient on prednisone pack taper, antihistamine topical cream, Depo-Medrol shot.   Provided education with printed handouts given  Rx sent to pharmacy  Follow-up with worsening or unresolved symptoms.

## 2020-03-13 NOTE — Patient Instructions (Signed)

## 2020-08-02 ENCOUNTER — Other Ambulatory Visit: Payer: Self-pay

## 2020-08-02 ENCOUNTER — Ambulatory Visit: Payer: BC Managed Care – PPO | Admitting: Family Medicine

## 2020-08-02 ENCOUNTER — Encounter: Payer: Self-pay | Admitting: Family Medicine

## 2020-08-02 VITALS — BP 98/57 | HR 89 | Temp 98.3°F | Wt 130.8 lb

## 2020-08-02 DIAGNOSIS — R399 Unspecified symptoms and signs involving the genitourinary system: Secondary | ICD-10-CM | POA: Diagnosis not present

## 2020-08-02 LAB — URINALYSIS, COMPLETE
Bilirubin, UA: NEGATIVE
Nitrite, UA: POSITIVE — AB
Specific Gravity, UA: 1.015 (ref 1.005–1.030)
Urobilinogen, Ur: 4 mg/dL — ABNORMAL HIGH (ref 0.2–1.0)
pH, UA: 5 (ref 5.0–7.5)

## 2020-08-02 LAB — MICROSCOPIC EXAMINATION: RBC: >30 /hpf — AB (ref 0–2)

## 2020-08-02 MED ORDER — CEPHALEXIN 500 MG PO CAPS: 500.0000 mg | ORAL_CAPSULE | Freq: Four times a day (QID) | ORAL | 0 refills | Status: DC

## 2020-08-02 NOTE — Progress Notes (Signed)
BP (!) 98/57   Pulse 89   Temp 98.3 F (36.8 C)   Wt 130 lb 12.8 oz (59.3 kg)   BMI 19.89 kg/m    Subjective:   Patient ID: Dorothy Douglas, female    DOB: 1995-09-19, 25 y.o.   MRN: [id number]  HPI: Dorothy Douglas is a 25 y.o. female presenting on 08/02/2020 for Pelvic Pain (Had baby in Oct Pelvic pain got worse in the last week. LMP today) and Urinary Frequency   HPI Pelvic pain and dysuria and lower abdominal pain. Patient is coming in complaining of pelvic pain and dysuria and lower abdominal pain that is been going on over the past month worse, she had a little bit off and on since she had a baby in October but over the past month she has noticed a little bit more of this.  She is currently on her menstrual cycle and about midway through it.  Relevant past medical, surgical, family and social history reviewed and updated as indicated. Interim medical history since our last visit reviewed. Allergies and medications reviewed and updated.  Review of Systems  Constitutional: Negative for chills and fever.  Eyes: Negative for visual disturbance.  Respiratory: Negative for chest tightness and shortness of breath.   Cardiovascular: Negative for chest pain and leg swelling.  Gastrointestinal: Positive for abdominal pain.  Genitourinary: Positive for dysuria, pelvic pain and vaginal bleeding. Negative for flank pain, hematuria, vaginal discharge and vaginal pain.  Musculoskeletal: Negative for back pain and gait problem.  Skin: Negative for rash.  Neurological: Negative for light-headedness and headaches.  Psychiatric/Behavioral: Negative for agitation and behavioral problems.  All other systems reviewed and are negative.   Per HPI unless specifically indicated above   Allergies as of 08/02/2020   No Known Allergies     Medication List       Accurate as of August 02, 2020  3:43 PM. If you have any questions, ask your nurse or doctor.        cephALEXin 500 MG  capsule Commonly known as: KEFLEX Take 1 capsule (500 mg total) by mouth 4 (four) times daily. Started by: Nils Pyle, MD   diphenhydrAMINE-zinc acetate cream Commonly known as: Benadryl Extra Strength Apply 1 application topically 3 (three) times daily as needed for itching.   levonorgestrel-ethinyl estradiol 0.15-30 MG-MCG tablet Commonly known as: NORDETTE Take 1 tablet by mouth daily.   predniSONE 10 MG (21) Tbpk tablet Commonly known as: STERAPRED UNI-PAK 21 TAB 6 tablets by mouth day 1, 5 tablets day 2, 4 tablet day 3, 3 tablet day 4, 2 tablets day day 5, and 1 tablet day 6   PRENATAL VITAMIN PO Take by mouth.        Objective:   BP (!) 98/57   Pulse 89   Temp 98.3 F (36.8 C)   Wt 130 lb 12.8 oz (59.3 kg)   BMI 19.89 kg/m   Wt Readings from Last 3 Encounters:  08/02/20 130 lb 12.8 oz (59.3 kg)  03/13/20 143 lb 9.6 oz (65.1 kg)  06/03/18 138 lb (62.6 kg)    Physical Exam Vitals and nursing note reviewed.  Constitutional:      General: She is not in acute distress.    Appearance: She is well-developed. She is not diaphoretic.  Eyes:     Conjunctiva/sclera: Conjunctivae normal.  Cardiovascular:     Rate and Rhythm: Normal rate and regular rhythm.     Heart sounds: Normal heart sounds. No murmur  heard.   Pulmonary:     Effort: Pulmonary effort is normal. No respiratory distress.     Breath sounds: Normal breath sounds. No wheezing.  Abdominal:     General: Bowel sounds are normal. There is no distension.     Palpations: Abdomen is soft. Abdomen is not rigid. There is no mass.     Tenderness: There is abdominal tenderness in the suprapubic area. There is no guarding or rebound.  Skin:    General: Skin is warm and dry.     Findings: No rash.  Neurological:     Mental Status: She is alert and oriented to person, place, and time.     Coordination: Coordination normal.  Psychiatric:        Behavior: Behavior normal.       Assessment & Plan:    Problem List Items Addressed This Visit   None   Visit Diagnoses    UTI symptoms    -  Primary   Relevant Medications   cephALEXin (KEFLEX) 500 MG capsule   Other Relevant Orders   Urinalysis, Complete (Completed)   Urine Culture      Urinalysis: 0-5 WBCs, greater than 30 RBCs, 0-10 epithelial cells, few bacteria, nitrite positive and 3+ leukocytes.  Patient is currently on her menstrual cycle hence high RBCs but with nitrite positive we will go ahead and treat like UTI. Follow up plan: Return if symptoms worsen or fail to improve.  Counseling provided for all of the vaccine components Orders Placed This Encounter  Procedures  . Urine Culture  . Microscopic Examination  . Urinalysis, Complete    Arville Care, MD [facility] 08/02/2020, 3:43 PM

## 2020-08-05 LAB — URINE CULTURE

## 2020-09-05 ENCOUNTER — Encounter: Payer: Self-pay | Admitting: Family Medicine

## 2021-05-31 ENCOUNTER — Emergency Department (HOSPITAL_BASED_OUTPATIENT_CLINIC_OR_DEPARTMENT_OTHER): Payer: BC Managed Care – PPO

## 2021-05-31 ENCOUNTER — Encounter (HOSPITAL_BASED_OUTPATIENT_CLINIC_OR_DEPARTMENT_OTHER): Payer: Self-pay

## 2021-05-31 ENCOUNTER — Emergency Department (HOSPITAL_BASED_OUTPATIENT_CLINIC_OR_DEPARTMENT_OTHER)
Admission: EM | Admit: 2021-05-31 | Discharge: 2021-05-31 | Disposition: A | Discharge status: Home / Self Care | Payer: BC Managed Care – PPO | Attending: Emergency Medicine | Admitting: Emergency Medicine

## 2021-05-31 ENCOUNTER — Other Ambulatory Visit: Payer: Self-pay

## 2021-05-31 ENCOUNTER — Emergency Department (HOSPITAL_BASED_OUTPATIENT_CLINIC_OR_DEPARTMENT_OTHER): Payer: BC Managed Care – PPO | Admitting: Radiology

## 2021-05-31 DIAGNOSIS — R110 Nausea: Secondary | ICD-10-CM | POA: Insufficient documentation

## 2021-05-31 DIAGNOSIS — R55 Syncope and collapse: Secondary | ICD-10-CM | POA: Diagnosis not present

## 2021-05-31 DIAGNOSIS — Z20822 Contact with and (suspected) exposure to covid-19: Secondary | ICD-10-CM | POA: Insufficient documentation

## 2021-05-31 DIAGNOSIS — Z79899 Other long term (current) drug therapy: Secondary | ICD-10-CM | POA: Insufficient documentation

## 2021-05-31 DIAGNOSIS — R000 Tachycardia, unspecified: Secondary | ICD-10-CM | POA: Diagnosis not present

## 2021-05-31 DIAGNOSIS — R509 Fever, unspecified: Secondary | ICD-10-CM | POA: Diagnosis present

## 2021-05-31 DIAGNOSIS — M791 Myalgia, unspecified site: Secondary | ICD-10-CM | POA: Insufficient documentation

## 2021-05-31 LAB — RESP PANEL BY RT-PCR (FLU A&B, COVID) ARPGX2
Influenza A by PCR: NEGATIVE
Influenza B by PCR: NEGATIVE
SARS Coronavirus 2 by RT PCR: NEGATIVE

## 2021-05-31 LAB — TROPONIN I (HIGH SENSITIVITY): Troponin I (High Sensitivity): <2 ng/L (ref <18)

## 2021-05-31 LAB — CBC WITH DIFFERENTIAL/PLATELET
Abs Immature Granulocytes: 0.02 10*3/uL (ref 0.00–0.07)
Basophils Absolute: 0 10*3/uL (ref 0.0–0.1)
Basophils Relative: 0 %
Eosinophils Absolute: 0 10*3/uL (ref 0.0–0.5)
Eosinophils Relative: 0 %
HCT: 41.7 % (ref 36.0–46.0)
Hemoglobin: 13.5 g/dL (ref 12.0–15.0)
Immature Granulocytes: 0 %
Lymphocytes Relative: 7 %
Lymphs Abs: 0.5 10*3/uL — ABNORMAL LOW (ref 0.7–4.0)
MCH: 27.9 pg (ref 26.0–34.0)
MCHC: 32.4 g/dL (ref 30.0–36.0)
MCV: 86.2 fL (ref 80.0–100.0)
Monocytes Absolute: 0.4 10*3/uL (ref 0.1–1.0)
Monocytes Relative: 5 %
Neutro Abs: 6.8 10*3/uL (ref 1.7–7.7)
Neutrophils Relative %: 88 %
Platelets: 211 10*3/uL (ref 150–400)
RBC: 4.84 MIL/uL (ref 3.87–5.11)
RDW: 12.5 % (ref 11.5–15.5)
WBC: 7.7 10*3/uL (ref 4.0–10.5)
nRBC: 0 % (ref 0.0–0.2)

## 2021-05-31 LAB — PREGNANCY, URINE: Preg Test, Ur: NEGATIVE

## 2021-05-31 LAB — COMPREHENSIVE METABOLIC PANEL
ALT: 11 U/L (ref 0–44)
AST: 11 U/L — ABNORMAL LOW (ref 15–41)
Albumin: 4.2 g/dL (ref 3.5–5.0)
Alkaline Phosphatase: 51 U/L (ref 38–126)
Anion gap: 9 (ref 5–15)
BUN: 8 mg/dL (ref 6–20)
CO2: 25 mmol/L (ref 22–32)
Calcium: 8.8 mg/dL — ABNORMAL LOW (ref 8.9–10.3)
Chloride: 101 mmol/L (ref 98–111)
Creatinine, Ser: 0.71 mg/dL (ref 0.44–1.00)
GFR, Estimated: >60 mL/min (ref >60)
Glucose, Bld: 127 mg/dL — ABNORMAL HIGH (ref 70–99)
Potassium: 3.5 mmol/L (ref 3.5–5.1)
Sodium: 135 mmol/L (ref 135–145)
Total Bilirubin: 0.6 mg/dL (ref 0.3–1.2)
Total Protein: 7.3 g/dL (ref 6.5–8.1)

## 2021-05-31 LAB — URINALYSIS, ROUTINE W REFLEX MICROSCOPIC
Bilirubin Urine: NEGATIVE
Glucose, UA: NEGATIVE mg/dL
Hgb urine dipstick: NEGATIVE
Ketones, ur: NEGATIVE mg/dL
Leukocytes,Ua: NEGATIVE
Nitrite: NEGATIVE
Protein, ur: NEGATIVE mg/dL
Specific Gravity, Urine: 1.011 (ref 1.005–1.030)
pH: 6.5 (ref 5.0–8.0)

## 2021-05-31 LAB — BRAIN NATRIURETIC PEPTIDE: B Natriuretic Peptide: 8.5 pg/mL (ref 0.0–100.0)

## 2021-05-31 LAB — D-DIMER, QUANTITATIVE: D-Dimer, Quant: 0.68 ug/mL-FEU — ABNORMAL HIGH (ref 0.00–0.50)

## 2021-05-31 MED ORDER — SODIUM CHLORIDE 0.9 % IV BOLUS
1000.0000 mL | Freq: Once | INTRAVENOUS | Status: AC
  Administered 2021-05-31: 1000 mL via INTRAVENOUS

## 2021-05-31 MED ORDER — ONDANSETRON 8 MG PO TBDP: 8.0000 mg | ORAL_TABLET | Freq: Three times a day (TID) | ORAL | 0 refills | Status: DC | PRN

## 2021-05-31 MED ORDER — IOHEXOL 350 MG/ML SOLN
75.0000 mL | Freq: Once | INTRAVENOUS | Status: AC | PRN
  Administered 2021-05-31: 75 mL via INTRAVENOUS

## 2021-05-31 MED ORDER — ONDANSETRON HCL 4 MG/2ML IJ SOLN
4.0000 mg | Freq: Once | INTRAMUSCULAR | Status: AC
  Administered 2021-05-31: 4 mg via INTRAVENOUS
  Filled 2021-05-31: qty 2

## 2021-05-31 NOTE — ED Notes (Signed)
Patient transported to CT 

## 2021-05-31 NOTE — ED Provider Notes (Signed)
[facility] EMERGENCY DEPT Provider Note   CSN: [id number] Arrival date & time: 05/31/21  1522     History  Chief Complaint  Patient presents with   Generalized Body Aches    Dorothy Douglas is a 26 y.o. female.  HPI  Patient presents due to fever and syncopal episode.  States her symptoms started today, she had generalized body aches and nausea without emesis.  Was getting up when she lost consciousness and fell backwards, her husband caught her.  Lost consciousness for roughly 30 seconds, denies any chest pain or feeling short of breath when this happened.  Denies any headache.  Does report decreased oral intake, no recent surgeries or travel.  No history of MI or PE.  Social history: No history of cigarettes  Medical history: Patient takes oral birth control.  Home Medications Prior to Admission medications   Medication Sig Start Date End Date Taking? Authorizing Provider  ondansetron (ZOFRAN-ODT) 8 MG disintegrating tablet Take 1 tablet (8 mg total) by mouth every 8 (eight) hours as needed for nausea or vomiting. 05/31/21  Yes Theron Arista, PA-C  cephALEXin (KEFLEX) 500 MG capsule Take 1 capsule (500 mg total) by mouth 4 (four) times daily. 08/02/20   Dettinger, Elige Radon, MD  levonorgestrel-ethinyl estradiol (NORDETTE) 0.15-30 MG-MCG tablet Take 1 tablet by mouth daily. 04/17/20   [provider]      Allergies    Patient has no known allergies.    Review of Systems   Review of Systems  Constitutional:  Positive for fever.  Neurological:  Positive for syncope.   Physical Exam Updated Vital Signs BP 123/66    Pulse 90    Temp 98.4 F (36.9 C)    Resp 20    Ht 5\' 8"  (1.727 m)    Wt 59.3 kg    LMP 05/20/2021    SpO2 99%    BMI 19.88 kg/m  Physical Exam Vitals and nursing note reviewed. Exam conducted with a chaperone present.  Constitutional:      Appearance: Normal appearance.  HENT:     Head: Normocephalic and atraumatic.  Eyes:      General: No scleral icterus.       Right eye: No discharge.        Left eye: No discharge.     Extraocular Movements: Extraocular movements intact.     Pupils: Pupils are equal, round, and reactive to light.  Cardiovascular:     Rate and Rhythm: Regular rhythm. Tachycardia present.     Pulses: Normal pulses.     Heart sounds: Normal heart sounds. No murmur heard.   No friction rub. No gallop.  Pulmonary:     Effort: Pulmonary effort is normal. No respiratory distress.     Breath sounds: Normal breath sounds.  Abdominal:     General: Abdomen is flat. Bowel sounds are normal. There is no distension.     Palpations: Abdomen is soft.     Tenderness: There is no abdominal tenderness.  Skin:    General: Skin is warm and dry.     Coloration: Skin is not jaundiced.  Neurological:     Mental Status: She is alert. Mental status is at baseline.     Coordination: Coordination normal.    ED Results / Procedures / Treatments   Labs (all labs ordered are listed, but only abnormal results are displayed) Labs Reviewed  COMPREHENSIVE METABOLIC PANEL - Abnormal; Notable for the following components:      Result Value  Glucose, Bld 127 (*)    Calcium 8.8 (*)    AST 11 (*)    All other components within normal limits  CBC WITH DIFFERENTIAL/PLATELET - Abnormal; Notable for the following components:   Lymphs Abs 0.5 (*)    All other components within normal limits  URINALYSIS, ROUTINE W REFLEX MICROSCOPIC - Abnormal; Notable for the following components:   Color, Urine COLORLESS (*)    Bacteria, UA RARE (*)    All other components within normal limits  D-DIMER, QUANTITATIVE - Abnormal; Notable for the following components:   D-Dimer, Quant 0.68 (*)    All other components within normal limits  RESP PANEL BY RT-PCR (FLU A&B, COVID) ARPGX2  PREGNANCY, URINE  BRAIN NATRIURETIC PEPTIDE  TROPONIN I (HIGH SENSITIVITY)    EKG EKG Interpretation  Date/Time:  Thursday May 31 2021 15:50:56  EST Ventricular Rate:  111 PR Interval:  138 QRS Duration: 84 QT Interval:  316 QTC Calculation: 429 R Axis:   88 Text Interpretation: Sinus tachycardia Right atrial enlargement T wave abnormality, consider inferior ischemia Abnormal ECG No previous ECGs available Confirmed by Alvira Monday ([id number]) on 05/31/2021 5:00:35 PM  Radiology DG Chest 2 View  Result Date: 05/31/2021 CLINICAL DATA:  Syncope, flu like symptoms, generalized body aches, fever EXAM: CHEST - 2 VIEW COMPARISON:  07/28/2017 FINDINGS: Normal heart size, mediastinal contours, and pulmonary vascularity. Lungs clear. No pulmonary infiltrate, pleural effusion, or pneumothorax. Osseous structures unremarkable. IMPRESSION: No acute abnormalities. Electronically Signed   By: Ulyses Southward M.D.   On: 05/31/2021 17:17   CT Angio Chest PE W/Cm &/Or Wo Cm  Result Date: 05/31/2021 CLINICAL DATA:  Flu like symptoms. Body aches, fever. Near syncope. Pulmonary embolism (PE) suspected, high prob EXAM: CT ANGIOGRAPHY CHEST WITH CONTRAST TECHNIQUE: Multidetector CT imaging of the chest was performed using the standard protocol during bolus administration of intravenous contrast. Multiplanar CT image reconstructions and MIPs were obtained to evaluate the vascular anatomy. RADIATION DOSE REDUCTION: This exam was performed according to the departmental dose-optimization program which includes automated exposure control, adjustment of the mA and/or kV according to patient size and/or use of iterative reconstruction technique. CONTRAST:  10mL OMNIPAQUE IOHEXOL 350 MG/ML SOLN COMPARISON:  None. FINDINGS: Cardiovascular: Heart is normal size. Aorta is normal caliber. No filling defects in the pulmonary arteries to suggest pulmonary emboli. Mediastinum/Nodes: No mediastinal, hilar, or axillary adenopathy. Trachea and esophagus are unremarkable. Thyroid unremarkable. Lungs/Pleura: Lungs are clear. No focal airspace opacities or suspicious nodules. No effusions.  Upper Abdomen: Imaging into the upper abdomen demonstrates no acute findings. Musculoskeletal: Chest wall soft tissues are unremarkable. No acute bony abnormality. Review of the MIP images confirms the above findings. IMPRESSION: No evidence of pulmonary embolus. No acute cardiopulmonary disease. Electronically Signed   By: Charlett Nose M.D.   On: 05/31/2021 18:11    Procedures Procedures    Medications Ordered in ED Medications  sodium chloride 0.9 % bolus 1,000 mL (0 mLs Intravenous Stopped 05/31/21 1849)  iohexol (OMNIPAQUE) 350 MG/ML injection 75 mL (75 mLs Intravenous Contrast Given 05/31/21 1754)  ondansetron (ZOFRAN) injection 4 mg (4 mg Intravenous Given 05/31/21 1850)    ED Course/ Medical Decision Making/ A&P                           Medical Decision Making Amount and/or Complexity of Data Reviewed Labs: ordered. Radiology: ordered.  Risk Prescription drug management.   This patient presents to the ED  for concern of syncope and viral symptoms, this involves an extensive number of treatment options, and is a complaint that carries with it a high risk of complications and morbidity.  The differential diagnosis includes PE, viral URI, pneumonia, CHF, MI, PE, dehydration, vasovagal, CVA, other   Co morbidities that complicate the patient evaluation: N/A   Additional history obtained: -Additional history obtained from husband  -External records from outside source obtained and reviewed including: Chart review including previous notes, labs, imaging, consultation notes   Lab Tests: -I ordered, reviewed, and interpreted labs.  The pertinent results include: No leukocytosis, no anemia.  No gross electrolyte derangement, negative for COVID or flu.  Initial troponin low, no ischemic findings on EKG so I doubt this is ACS.  Not pregnant, no UTI   EKG -Tachycardic, right atrial lodgment and some abnormal T wave morphology in lead I, 2, aVF   Imaging Studies ordered: -I  ordered imaging studies including CXR, CTA Chest/lungs  -I independently visualized and interpreted imaging which showed no evidence of cardiomegaly or pneumonia, no pneumothorax.  CTA without evidence of PE or pleural effusion. -I agree with the radiologist interpretation   Medicines ordered and prescription drug management: -I ordered medication including Zofran for nausea  -Reevaluation of the patient after these medicines showed that the patient improved -I have reviewed the patients home medicines and have made adjustments as needed  ED Course: Given tachycardia and abnormal EKG patient was worked up for possible PE especially given she is on oral anticoagulation.  Work-up does not show any evidence of PE.  I doubt this is ACS or dissection.  No evidence of underlying pneumonia, negative for flu or COVID.  No QT prolongation or Brugada on the EKG concerning for an arrhythmia as the etiology for her syncopal event.  She has been observed for 4 hours without any repeat event.  Orthostatics are notable for greater than 30 bpm going from standing to sitting.  This could be consistent with vasovagal as a source of her syncopal event earlier today.  At this time do not feel patient needs additional imaging or work-up.      Cardiac Monitoring: The patient was maintained on a cardiac monitor.  I personally viewed and interpreted the cardiac monitored which showed an underlying rhythm of: Sinus tachycardia    Reevaluation: After the interventions noted above, I reevaluated the patient and found that they have :improved   Dispostion: PCP follow up         Final Clinical Impression(s) / ED Diagnoses Final diagnoses:  Syncope, unspecified syncope type    Rx / DC Orders ED Discharge Orders          Ordered    ondansetron (ZOFRAN-ODT) 8 MG disintegrating tablet  Every 8 hours PRN        05/31/21 1849              Theron AristaSage, Ehren Berisha, PA-C 05/31/21 1856    Alvira MondaySchlossman, Erin,  MD 06/02/21 1243

## 2021-05-31 NOTE — Discharge Instructions (Addendum)
Your work-up today was reassuring.  I do not see any evidence of blood clots, pneumonia, UTI, anemia, electrolyte derangement causing your symptoms.  I suspect it was likely secondary to dehydration, he can take the Zofran as needed for nausea.  If this continues to happen follow-up with your primary or return back to the ED for additional work-up.

## 2021-05-31 NOTE — ED Triage Notes (Signed)
Patient here POV from Home with Flu-Like Symptoms.  Patient awoke this AM with Generalized Body Aches, Fever.  Patient endorses Near Syncopal Episode today. Family caught Patient and Patient aroused quickly soon after. No Injuries.  Temp: 99.5 at Home.  NAD Noted during Triage. A&Ox4. GCS 15. Ambulatory.

## 2021-12-10 ENCOUNTER — Ambulatory Visit (INDEPENDENT_AMBULATORY_CARE_PROVIDER_SITE_OTHER): Payer: BC Managed Care – PPO

## 2021-12-10 ENCOUNTER — Ambulatory Visit: Payer: BC Managed Care – PPO | Admitting: Nurse Practitioner

## 2021-12-10 ENCOUNTER — Encounter: Payer: Self-pay | Admitting: Nurse Practitioner

## 2021-12-10 VITALS — BP 120/76 | HR 98 | Temp 98.6°F | Ht 68.0 in | Wt 135.0 lb

## 2021-12-10 DIAGNOSIS — S99912A Unspecified injury of left ankle, initial encounter: Secondary | ICD-10-CM

## 2021-12-10 MED ORDER — PREDNISONE 10 MG PO TABS: 10.0000 mg | ORAL_TABLET | Freq: Every day | ORAL | 0 refills | Status: DC

## 2021-12-10 MED ORDER — METHYLPREDNISOLONE ACETATE 80 MG/ML IJ SUSP
80.0000 mg | Freq: Once | INTRAMUSCULAR | Status: AC
  Administered 2021-12-10: 80 mg via INTRAMUSCULAR

## 2021-12-10 MED ORDER — IBUPROFEN 600 MG PO TABS: 600.0000 mg | ORAL_TABLET | Freq: Three times a day (TID) | ORAL | 0 refills | Status: DC | PRN

## 2021-12-10 NOTE — Patient Instructions (Signed)
Ankle Sprain  An ankle sprain is a stretch or tear in one of the tough tissues (ligaments) that connect the bones in your ankle. An ankle sprain can happen when the ankle rolls outward (inversion sprain) or inward (eversion sprain). What are the causes? This condition is caused by rolling or twisting the ankle. What increases the risk? You are more likely to develop this condition if you play sports. What are the signs or symptoms? Symptoms of this condition include: Pain in your ankle. Swelling. Bruising. This may happen right after you sprain your ankle or 1-2 days later. Trouble standing or walking. How is this diagnosed? This condition is diagnosed with: A physical exam. During the exam, your doctor will press on certain parts of your foot and ankle and try to move them in certain ways. X-ray imaging. These may be taken to see how bad the sprain is and to check for broken bones. How is this treated? This condition may be treated with: A brace or splint. This is used to keep the ankle from moving until it heals. An elastic bandage. This is used to support the ankle. Crutches. Pain medicine. Surgery. This may be needed if the sprain is very bad. Physical therapy. This may help to improve movement in the ankle. Follow these instructions at home: If you have a brace or a splint: Wear the brace or splint as told by your doctor. Remove it only as told by your doctor. Loosen the brace or splint if your toes: Tingle. Lose feeling (become numb). Turn cold and blue. Keep the brace or splint clean. If the brace or splint is not waterproof: Do not let it get wet. Cover it with a watertight covering when you take a bath or a shower. If you have an elastic bandage (dressing): Remove it to shower or bathe. Try not to move your ankle much, but wiggle your toes from time to time. This helps to prevent swelling. Adjust the dressing if it feels too tight. Loosen the dressing if your  foot: Loses feeling. Tingles. Becomes cold and blue. Managing pain, stiffness, and swelling  Take over-the-counter and prescription medicines only as told by doctor. For 2-3 days, keep your ankle raised (elevated) above the level of your heart. If told, put ice on the injured area: If you have a removable brace or splint, remove it as told by your doctor. Put ice in a plastic bag. Place a towel between your skin and the bag. Leave the ice on for 20 minutes, 2-3 times a day. General instructions Rest your ankle. Do not use your injured leg to support your body weight until your doctor says that you can. Use crutches as told by your doctor. Do not use any products that contain nicotine or tobacco, such as cigarettes, e-cigarettes, and chewing tobacco. If you need help quitting, ask your doctor. Keep all follow-up visits as told by your doctor. Contact a doctor if: Your bruises or swelling are quickly getting worse. Your pain does not get better after you take medicine. Get help right away if: You cannot feel your toes or foot. Your foot or toes look blue. You have very bad pain that gets worse. Summary An ankle sprain is a stretch or tear in one of the tough tissues (ligaments) that connect the bones in your ankle. This condition is caused by rolling or twisting the ankle. Symptoms include pain, swelling, bruising, and trouble walking. To help with pain and swelling, put ice on the injured   ankle, raise your ankle above the level of your heart, and use an elastic bandage. Also, rest as told by your doctor. Keep all follow-up visits as told by your doctor. This is important. This information is not intended to replace advice given to you by your health care provider. Make sure you discuss any questions you have with your health care provider. Document Revised: 06/15/2020 Document Reviewed: 06/15/2020 Elsevier Patient Education  2023 [employer]  

## 2021-12-10 NOTE — Progress Notes (Signed)
Acute Office Visit  Subjective:     Patient ID: Dorothy Douglas, female    DOB: 1995/11/01, 26 y.o.   MRN: [id number]  No chief complaint on file.   Foot Injury  The incident occurred 2 days ago. The incident occurred at home. The injury mechanism was a fall. The pain is present in the left leg and left ankle. The pain is at a severity of 7/10. The pain is severe. The pain has been Constant since onset. Pertinent negatives include no numbness or tingling. She reports no foreign bodies present. The symptoms are aggravated by movement and palpation. She has tried ice and NSAIDs for the symptoms. The treatment provided moderate relief.    Review of Systems  Constitutional: Negative.   HENT: Negative.    Eyes: Negative.   Cardiovascular: Negative.   Gastrointestinal: Negative.   Genitourinary: Negative.   Musculoskeletal:  Positive for joint pain.  Skin: Negative.   Neurological:  Negative for tingling and numbness.  All other systems reviewed and are negative.       Objective:    BP 120/76   Pulse 98   Temp 98.6 F (37 C)   Ht 5\' 8"  (1.727 m)   Wt 135 lb (61.2 kg)   LMP 12/08/2021 Comment: end date  SpO2 100%   BMI 20.53 kg/m    Physical Exam Vitals and nursing note reviewed.  Constitutional:      Appearance: Normal appearance.  HENT:     Head: Normocephalic.     Right Ear: External ear normal.     Left Ear: External ear normal.     Nose: Nose normal.     Mouth/Throat:     Mouth: Mucous membranes are moist.     Pharynx: Oropharynx is clear.  Eyes:     Conjunctiva/sclera: Conjunctivae normal.  Cardiovascular:     Rate and Rhythm: Normal rate and regular rhythm.     Pulses: Normal pulses.     Heart sounds: Normal heart sounds.  Pulmonary:     Effort: Pulmonary effort is normal.     Breath sounds: Normal breath sounds.  Abdominal:     General: Bowel sounds are normal.  Musculoskeletal:     Left ankle: Swelling present. Tenderness present. Decreased range  of motion.     Left foot: Decreased range of motion. Swelling and tenderness present.       Legs:  Skin:    General: Skin is warm.     Findings: No erythema or rash.  Neurological:     Mental Status: She is alert and oriented to person, place, and time.     No results found for any visits on 12/10/21.      Assessment & Plan:  Patient fell in the past 24 hours.  Symptoms not well controlled.  Mild swelling and tenderness assessed.  Completed x-ray with results pending.  Ibuprofen 600 mg tablet by mouth every 8 hours as needed.  Prednisone 10 mg tablet by mouth for 6 days.  Continue ice for the next 24 hours and elevate, warm compress as needed after the 24 hours.  80 Depo-Medrol shot given in clinic.  Follow-up with worsening unresolved symptoms. Problem List Items Addressed This Visit   None Visit Diagnoses     Injury of left ankle, initial encounter    -  Primary   Relevant Medications   ibuprofen (ADVIL) 600 MG tablet   predniSONE (DELTASONE) 10 MG tablet   methylPREDNISolone acetate (DEPO-MEDROL) injection 80 mg (Completed)  Other Relevant Orders   DG Foot Complete Left       Meds ordered this encounter  Medications   ibuprofen (ADVIL) 600 MG tablet    Sig: Take 1 tablet (600 mg total) by mouth every 8 (eight) hours as needed.    Dispense:  30 tablet    Refill:  0    Order Specific Question:   Supervising Provider    Answer:   Standley Brooking   predniSONE (DELTASONE) 10 MG tablet    Sig: Take 1 tablet (10 mg total) by mouth daily with breakfast.    Dispense:  6 tablet    Refill:  0    Order Specific Question:   Supervising Provider    Answer:   Mechele Claude [[id number]]   methylPREDNISolone acetate (DEPO-MEDROL) injection 80 mg    Return if symptoms worsen or fail to improve.  Daryll Drown, NP

## 2021-12-12 ENCOUNTER — Other Ambulatory Visit: Payer: Self-pay | Admitting: Nurse Practitioner

## 2021-12-12 ENCOUNTER — Telehealth: Payer: Self-pay | Admitting: Family

## 2021-12-12 NOTE — Telephone Encounter (Signed)
Called and spoke to patient - she would like to know how long she should wear the boot and how long she should give it to improving before following up  Please advise

## 2021-12-12 NOTE — Telephone Encounter (Signed)
2-6 weeks is usually what is recommended but she has no fractures so she may need less time

## 2021-12-13 NOTE — Telephone Encounter (Signed)
Patient aware and verbalized understanding. °

## 2022-01-30 ENCOUNTER — Encounter: Payer: Self-pay | Admitting: Family Medicine

## 2022-01-30 ENCOUNTER — Ambulatory Visit: Payer: BC Managed Care – PPO | Admitting: Family Medicine

## 2022-01-30 VITALS — BP 122/76 | HR 98 | Temp 96.8°F | Resp 20 | Ht 68.0 in | Wt 131.0 lb

## 2022-01-30 DIAGNOSIS — H6123 Impacted cerumen, bilateral: Secondary | ICD-10-CM

## 2022-01-30 NOTE — Progress Notes (Signed)
   Assessment & Plan:  1. Excessive cerumen in ear canal, bilateral Irrigation successful; patient tolerated well.  Education provided on earwax buildup. Discussed irrigation at home.    Follow up plan: Return if symptoms worsen or fail to improve.  Hendricks Limes, MSN, APRN, FNP-C [facility]  Subjective:   Patient ID: Dorothy Douglas, female    DOB: 11/16/1995, 26 y.o.   MRN: [id number]  HPI: Dorothy Douglas is a 26 y.o. female presenting on 01/30/2022 for Ear Fullness (Echo / fluid possible - can't hear L>R)  Patient reports she cannot hear out of her left ear. Both ears have been popping and she has an echo in them. Her sister thought she might have ear wax built up, so she has been using Debrox, but has not flushed the ears.   ROS: Negative unless specifically indicated above in HPI.   Relevant past medical history reviewed and updated as indicated.   Allergies and medications reviewed and updated.   Current Outpatient Medications:    ibuprofen (ADVIL) 600 MG tablet, Take 1 tablet (600 mg total) by mouth every 8 (eight) hours as needed., Disp: 30 tablet, Rfl: 0   levonorgestrel-ethinyl estradiol (NORDETTE) 0.15-30 MG-MCG tablet, Take 1 tablet by mouth daily., Disp: , Rfl:   No Known Allergies  Objective:   BP 122/76   Pulse 98   Temp (!) 96.8 F (36 C)   Resp 20   Ht 5\' 8"  (1.727 m)   Wt 131 lb (59.4 kg)   LMP 01/23/2022 (Exact Date)   SpO2 96%   BMI 19.92 kg/m    Physical Exam Vitals reviewed.  Constitutional:      General: She is not in acute distress.    Appearance: Normal appearance. She is not ill-appearing, toxic-appearing or diaphoretic.  HENT:     Head: Normocephalic and atraumatic.     Right Ear: Tympanic membrane, ear canal and external ear normal. There is impacted cerumen.     Left Ear: Tympanic membrane, ear canal and external ear normal. There is impacted cerumen.  Eyes:     General: No scleral icterus.       Right eye: No  discharge.        Left eye: No discharge.     Conjunctiva/sclera: Conjunctivae normal.  Cardiovascular:     Rate and Rhythm: Normal rate.  Pulmonary:     Effort: Pulmonary effort is normal. No respiratory distress.  Musculoskeletal:        General: Normal range of motion.     Cervical back: Normal range of motion.  Skin:    General: Skin is warm and dry.     Capillary Refill: Capillary refill takes less than 2 seconds.  Neurological:     General: No focal deficit present.     Mental Status: She is alert and oriented to person, place, and time. Mental status is at baseline.  Psychiatric:        Mood and Affect: Mood normal.        Behavior: Behavior normal.        Thought Content: Thought content normal.        Judgment: Judgment normal.

## 2022-03-07 ENCOUNTER — Ambulatory Visit: Payer: BC Managed Care – PPO

## 2022-03-07 ENCOUNTER — Ambulatory Visit: Payer: BC Managed Care – PPO | Admitting: Family Medicine

## 2022-03-07 ENCOUNTER — Encounter: Payer: Self-pay | Admitting: Family Medicine

## 2022-03-07 VITALS — BP 111/64 | HR 85 | Temp 98.1°F | Ht 68.0 in | Wt 133.0 lb

## 2022-03-07 DIAGNOSIS — J208 Acute bronchitis due to other specified organisms: Secondary | ICD-10-CM | POA: Diagnosis not present

## 2022-03-07 MED ORDER — METHYLPREDNISOLONE ACETATE 80 MG/ML IJ SUSP
80.0000 mg | Freq: Once | INTRAMUSCULAR | Status: AC
  Administered 2022-03-07: 80 mg via INTRAMUSCULAR

## 2022-03-07 MED ORDER — PREDNISONE 20 MG PO TABS: 40.0000 mg | ORAL_TABLET | Freq: Every day | ORAL | 0 refills | Status: AC

## 2022-03-07 NOTE — Progress Notes (Signed)
Acute Office Visit  Subjective:     Patient ID: Dorothy Douglas, female    DOB: Jul 06, 1995, 26 y.o.   MRN: [id number]  Chief Complaint  Patient presents with   Cough    Cough This is a new problem. Episode onset: 2 weeks ago with Covid infection. The problem has been gradually worsening. The problem occurs every few minutes (having coughing fits up to 15 minutes at a time). The cough is Non-productive. Associated symptoms include chest pain (with coughing fits), a sore throat (intermittent) and shortness of breath (with coughing fits). Pertinent negatives include no chills, fever, nasal congestion, sweats or wheezing. The symptoms are aggravated by lying down (talking, laughing). Treatments tried: mucinex, vicks vapor rub, fluids, elevation in bed, honey. The treatment provided mild relief. There is no history of asthma, bronchitis, COPD or pneumonia.    Review of Systems  Constitutional:  Negative for chills and fever.  HENT:  Positive for sore throat (intermittent).   Respiratory:  Positive for cough and shortness of breath (with coughing fits). Negative for wheezing.   Cardiovascular:  Positive for chest pain (with coughing fits).        Objective:    BP 111/64   Pulse 85   Temp 98.1 F (36.7 C) (Temporal)   Ht 5\' 8"  (1.727 m)   Wt 133 lb (60.3 kg)   SpO2 100%   BMI 20.22 kg/m    Physical Exam Vitals and nursing note reviewed.  Constitutional:      General: She is not in acute distress.    Appearance: She is not ill-appearing, toxic-appearing or diaphoretic.  HENT:     Head: Normocephalic and atraumatic.     Right Ear: Tympanic membrane, ear canal and external ear normal.     Left Ear: Tympanic membrane, ear canal and external ear normal.     Nose: No congestion or rhinorrhea.     Mouth/Throat:     Mouth: Mucous membranes are moist.     Pharynx: Oropharynx is clear. No oropharyngeal exudate or posterior oropharyngeal erythema.  Eyes:     General:        Right  eye: No discharge.        Left eye: No discharge.     Conjunctiva/sclera: Conjunctivae normal.  Cardiovascular:     Rate and Rhythm: Normal rate and regular rhythm.     Heart sounds: Normal heart sounds. No murmur heard. Pulmonary:     Effort: Pulmonary effort is normal. No respiratory distress.     Breath sounds: Normal breath sounds. No wheezing, rhonchi or rales.  Musculoskeletal:     Cervical back: Neck supple.  Lymphadenopathy:     Cervical: No cervical adenopathy.  Neurological:     General: No focal deficit present.     Mental Status: She is alert and oriented to person, place, and time.  Psychiatric:        Mood and Affect: Mood normal.        Behavior: Behavior normal.     No results found for any visits on 03/07/22.      Assessment & Plan:   Dorothy was seen today for cough.  Diagnoses and all orders for this visit:  Viral bronchitis Following Covid infection. Steroid IM injection today, start prednisone burst tomorrow. Continue mucinex. Return to office for new or worsening symptoms, or if symptoms persist.  -     methylPREDNISolone acetate (DEPO-MEDROL) injection 80 mg -     predniSONE (DELTASONE) 20 MG tablet;  Take 2 tablets (40 mg total) by mouth daily with breakfast for 5 days. Start tomorrow  The patient indicates understanding of these issues and agrees with the plan.   Gwenlyn Perking, FNP

## 2022-03-13 ENCOUNTER — Telehealth (INDEPENDENT_AMBULATORY_CARE_PROVIDER_SITE_OTHER): Payer: BC Managed Care – PPO | Admitting: Family Medicine

## 2022-03-13 ENCOUNTER — Encounter: Payer: Self-pay | Admitting: Family Medicine

## 2022-03-13 DIAGNOSIS — J0140 Acute pansinusitis, unspecified: Secondary | ICD-10-CM

## 2022-03-13 DIAGNOSIS — J069 Acute upper respiratory infection, unspecified: Secondary | ICD-10-CM | POA: Diagnosis not present

## 2022-03-13 DIAGNOSIS — R110 Nausea: Secondary | ICD-10-CM

## 2022-03-13 MED ORDER — PSEUDOEPH-BROMPHEN-DM 30-2-10 MG/5ML PO SYRP: 5.0000 mL | ORAL_SOLUTION | Freq: Four times a day (QID) | ORAL | 0 refills | Status: DC | PRN

## 2022-03-13 MED ORDER — ONDANSETRON HCL 4 MG PO TABS: 4.0000 mg | ORAL_TABLET | Freq: Three times a day (TID) | ORAL | 0 refills | Status: DC | PRN

## 2022-03-13 MED ORDER — FLUTICASONE PROPIONATE 50 MCG/ACT NA SUSP: 2.0000 | Freq: Every day | NASAL | 6 refills | Status: DC

## 2022-03-13 MED ORDER — AMOXICILLIN-POT CLAVULANATE 875-125 MG PO TABS: 1.0000 | ORAL_TABLET | Freq: Two times a day (BID) | ORAL | 0 refills | Status: DC

## 2022-03-13 NOTE — Progress Notes (Signed)
Virtual Visit via MyChart Video Note Due to COVID-19 pandemic this visit was conducted virtually. This visit type was conducted due to national recommendations for restrictions regarding the COVID-19 Pandemic (e.g. social distancing, sheltering in place) in an effort to limit this patient's exposure and mitigate transmission in our community. All issues noted in this document were discussed and addressed.  A physical exam was not performed with this format.   I connected with Dorothy Douglas on 03/13/2022 at 0920 by MyChart Video and verified that I am speaking with the correct person using two identifiers. Dorothy Douglas is currently located at home and patient is currently with them during visit. The provider, Kari Baars, FNP is located in their office at time of visit.  I discussed the limitations, risks, security and privacy concerns of performing an evaluation and management service by virtual visit and the availability of in person appointments. I also discussed with the patient that there may be a patient responsible charge related to this service. The patient expressed understanding and agreed to proceed.  Subjective:  Patient ID: Dorothy Douglas, female    DOB: 09/16/1995, 26 y.o.   MRN: [id number]  Chief Complaint:  Cough and Sinus Problem   HPI: Dorothy Douglas is a 26 y.o. female presenting on 03/13/2022 for Cough and Sinus Problem   Cough This is a recurrent problem. Episode onset: 3 weeks ago. The problem has been gradually worsening. The problem occurs constantly. The cough is Productive of purulent sputum. Associated symptoms include chills, headaches, nasal congestion, postnasal drip, rhinorrhea, a sore throat and shortness of breath. Pertinent negatives include no chest pain, ear congestion, ear pain, fever, heartburn, hemoptysis, myalgias, rash, sweats, weight loss or wheezing. Nothing aggravates the symptoms. She has tried oral steroids, rest, prescription cough suppressant and  OTC cough suppressant for the symptoms. The treatment provided no relief.  Sinus Problem This is a new problem. Episode onset: 3 weeks ago. The problem has been gradually worsening since onset. The pain is moderate. Associated symptoms include chills, congestion, coughing, headaches, shortness of breath, sinus pressure and a sore throat. Pertinent negatives include no diaphoresis, ear pain, hoarse voice, neck pain, sneezing or swollen glands. Past treatments include oral decongestants and acetaminophen. The treatment provided no relief.     Relevant past medical, surgical, family, and social history reviewed and updated as indicated.  Allergies and medications reviewed and updated.   History reviewed. No pertinent past medical history.  History reviewed. No pertinent surgical history.  Social History   Socioeconomic History   Marital status: Married    Spouse name: Not on file   Number of children: Not on file   Years of education: Not on file   Highest education level: Not on file  Occupational History   Not on file  Tobacco Use   Smoking status: Never   Smokeless tobacco: Never  Vaping Use   Vaping Use: Never used  Substance and Sexual Activity   Alcohol use: Yes    Alcohol/week: 1.0 standard drink of alcohol    Types: 1 Glasses of wine per week   Drug use: No   Sexual activity: Yes  Other Topics Concern   Not on file  Social History Narrative   Not on file   Social Determinants of Health   Financial Resource Strain: Not on file  Food Insecurity: Not on file  Transportation Needs: Not on file  Physical Activity: Not on file  Stress: Not on file  Social Connections: Not on file  Intimate Partner Violence: Not on file    Outpatient Encounter Medications as of 03/13/2022  Medication Sig   amoxicillin-clavulanate (AUGMENTIN) 875-125 MG tablet Take 1 tablet by mouth 2 (two) times daily.   brompheniramine-pseudoephedrine-DM 30-2-10 MG/5ML syrup Take 5 mLs by mouth 4  (four) times daily as needed.   fluticasone (FLONASE) 50 MCG/ACT nasal spray Place 2 sprays into both nostrils daily.   ondansetron (ZOFRAN) 4 MG tablet Take 1 tablet (4 mg total) by mouth every 8 (eight) hours as needed for nausea or vomiting.   levonorgestrel-ethinyl estradiol (NORDETTE) 0.15-30 MG-MCG tablet Take 1 tablet by mouth daily.   No facility-administered encounter medications on file as of 03/13/2022.    No Known Allergies  Review of Systems  Constitutional:  Positive for activity change, appetite change, chills and fatigue. Negative for diaphoresis, fever, unexpected weight change and weight loss.  HENT:  Positive for congestion, postnasal drip, rhinorrhea, sinus pressure, sinus pain, sore throat and voice change. Negative for dental problem, drooling, ear discharge, ear pain, facial swelling, hearing loss, hoarse voice, mouth sores, nosebleeds, sneezing, tinnitus and trouble swallowing.   Respiratory:  Positive for cough and shortness of breath. Negative for apnea, hemoptysis, choking, chest tightness, wheezing and stridor.   Cardiovascular:  Negative for chest pain, palpitations and leg swelling.  Gastrointestinal:  Positive for nausea. Negative for abdominal distention, abdominal pain, anal bleeding, blood in stool, constipation, diarrhea, heartburn, rectal pain and vomiting.  Genitourinary:  Negative for decreased urine volume and difficulty urinating.  Musculoskeletal:  Negative for arthralgias, myalgias and neck pain.  Skin:  Negative for rash.  Neurological:  Positive for headaches. Negative for dizziness, tremors, seizures, syncope, facial asymmetry, speech difficulty, weakness, light-headedness and numbness.  Psychiatric/Behavioral:  Negative for confusion.   All other systems reviewed and are negative.        Observations/Objective: No vital signs or physical exam, this was a virtual health encounter.  Pt alert and oriented, answers all questions appropriately, and  able to speak in full sentences.    Assessment and Plan: Dorothy Douglas was seen today for cough and sinus problem.  Diagnoses and all orders for this visit:  Acute non-recurrent pansinusitis URI with cough and congestion Ongoing symptoms for over 3 weeks despite symptomatic care at home. Will add bromfed, flonase and augmentin to regimen as symptoms are persistent. Report new, worsening, or persistent symptoms.  -     brompheniramine-pseudoephedrine-DM 30-2-10 MG/5ML syrup; Take 5 mLs by mouth 4 (four) times daily as needed. -     amoxicillin-clavulanate (AUGMENTIN) 875-125 MG tablet; Take 1 tablet by mouth 2 (two) times daily. -     fluticasone (FLONASE) 50 MCG/ACT nasal spray; Place 2 sprays into both nostrils daily.  Nausea in adult Will provide below for as needed use. Report new, worsening, or persistent symptoms.  -     ondansetron (ZOFRAN) 4 MG tablet; Take 1 tablet (4 mg total) by mouth every 8 (eight) hours as needed for nausea or vomiting.     Follow Up Instructions: Return if symptoms worsen or fail to improve.    I discussed the assessment and treatment plan with the patient. The patient was provided an opportunity to ask questions and all were answered. The patient agreed with the plan and demonstrated an understanding of the instructions.   The patient was advised to call back or seek an in-person evaluation if the symptoms worsen or if the condition fails to improve as anticipated.  The above assessment and management plan was  discussed with the patient. The patient verbalized understanding of and has agreed to the management plan. Patient is aware to call the clinic if they develop any new symptoms or if symptoms persist or worsen. Patient is aware when to return to the clinic for a follow-up visit. Patient educated on when it is appropriate to go to the emergency department.    I provided 15 minutes of time during this MyChart Video encounter.   Monia Pouch,  FNP-C [facility] [address] [place], [place] [address] [phone] 03/13/2022

## 2022-03-18 ENCOUNTER — Telehealth: Payer: Self-pay | Admitting: Family

## 2022-03-18 MED ORDER — ALBUTEROL SULFATE HFA 108 (90 BASE) MCG/ACT IN AERS: 2.0000 | INHALATION_SPRAY | Freq: Four times a day (QID) | RESPIRATORY_TRACT | 0 refills | Status: DC | PRN

## 2022-03-18 NOTE — Telephone Encounter (Signed)
Albuterol inhaler Prescription sent to pharmacy, work note extended.

## 2022-03-18 NOTE — Telephone Encounter (Signed)
  Incoming Patient Call  03/18/2022  What symptoms do you have? Cough is better but concerned about being out of breathe at times. Can pt get an inhaler to get the strength back? Pt was suppose to go back to work but will go back tomorrow. Can pt get an updated work note to go back to work tomorrow? Please call when ready.  How long have you been sick? Televisit on 03/13/2022  Have you been seen for this problem? 03/13/2022  If your provider decides to give you a prescription, which pharmacy would you like for it to be sent to? [facility]   Patient informed that this information will be sent to the clinical staff for review and that they should receive a follow up call.

## 2022-03-18 NOTE — Telephone Encounter (Signed)
Patient aware and verbalized understanding. °

## 2022-04-09 ENCOUNTER — Other Ambulatory Visit: Payer: Self-pay | Admitting: Family

## 2023-04-15 ENCOUNTER — Ambulatory Visit: Payer: BC Managed Care – PPO | Admitting: Family

## 2023-04-15 ENCOUNTER — Encounter: Payer: Self-pay | Admitting: Family

## 2023-04-15 VITALS — BP 103/70 | HR 79 | Temp 97.6°F | Resp 20 | Ht 68.0 in | Wt 152.0 lb

## 2023-04-15 DIAGNOSIS — Z0000 Encounter for general adult medical examination without abnormal findings: Secondary | ICD-10-CM | POA: Diagnosis not present

## 2023-04-15 DIAGNOSIS — Z3A24 24 weeks gestation of pregnancy: Secondary | ICD-10-CM

## 2023-04-15 NOTE — Patient Instructions (Signed)

## 2023-04-15 NOTE — Progress Notes (Signed)
   Subjective:    Patient ID: Dorothy Douglas, female    DOB: 18-Sep-1995, 27 y.o.   MRN: [id number]  Chief Complaint  Patient presents with   Gynecologic Exam   Annual Exam     HPI  Pt presents to the office today for CPE. She is currently [redacted] weeks pregnant and followed by GYN. She is taking prenatals and doing well. Pt denies any headache, palpitations, SOB, or edema at this time.   Review of Systems  All other systems reviewed and are negative.      Objective:   Physical Exam Vitals reviewed.  Constitutional:      General: She is not in acute distress.    Appearance: She is well-developed.  HENT:     Head: Normocephalic and atraumatic.     Right Ear: Tympanic membrane normal.     Left Ear: Tympanic membrane normal.  Eyes:     Pupils: Pupils are equal, round, and reactive to light.  Neck:     Thyroid: No thyromegaly.  Cardiovascular:     Rate and Rhythm: Normal rate and regular rhythm.     Heart sounds: Normal heart sounds. No murmur heard. Pulmonary:     Effort: Pulmonary effort is normal. No respiratory distress.     Breath sounds: Normal breath sounds. No wheezing.  Abdominal:     General: Bowel sounds are normal. There is no distension.     Palpations: Abdomen is soft.     Tenderness: There is no abdominal tenderness.  Musculoskeletal:        General: No tenderness. Normal range of motion.     Cervical back: Normal range of motion and neck supple.  Skin:    General: Skin is warm and dry.  Neurological:     Mental Status: She is alert and oriented to person, place, and time.     Cranial Nerves: No cranial nerve deficit.     Deep Tendon Reflexes: Reflexes are normal and symmetric.  Psychiatric:        Behavior: Behavior normal.        Thought Content: Thought content normal.        Judgment: Judgment normal.    BP 103/70   Pulse 79   Temp 97.6 F (36.4 C) (Temporal)   Resp 20   Ht 5\' 8"  (1.727 m)   Wt 152 lb (68.9 kg)   SpO2 97%   BMI 23.11 kg/m        Assessment & Plan:  Dorothy Mckellar comes in today with chief complaint of Annual Exam   Diagnosis and orders addressed:  1. Annual physical exam  2. [redacted] weeks gestation of pregnancy   Labs refused at this time since getting labs at [facility] Maintenance reviewed Diet and exercise encouraged  Follow up plan: 1 year    Jannifer Rodney, FNP
